# Patient Record
Sex: Female | Born: 1970 | Race: Black or African American | Hispanic: No | Marital: Married | Smoking: Never smoker
Health system: Southern US, Community
[De-identification: ages and names within clinical notes are randomized; demographics above are authoritative.]

## PROBLEM LIST (undated history)

## (undated) DIAGNOSIS — I10 Essential (primary) hypertension: Secondary | ICD-10-CM

## (undated) DIAGNOSIS — D649 Anemia, unspecified: Secondary | ICD-10-CM

## (undated) DIAGNOSIS — A6000 Herpesviral infection of urogenital system, unspecified: Secondary | ICD-10-CM

## (undated) HISTORY — PX: CHOLECYSTECTOMY: SHX55

## (undated) HISTORY — DX: Essential (primary) hypertension: I10

## (undated) HISTORY — PX: CERVICAL BIOPSY  W/ LOOP ELECTRODE EXCISION: SUR135

## (undated) HISTORY — DX: Anemia, unspecified: D64.9

## (undated) HISTORY — PX: TUBAL LIGATION: SHX77

## (undated) HISTORY — DX: Herpesviral infection of urogenital system, unspecified: A60.00

## (undated) HISTORY — PX: TONSILLECTOMY AND ADENOIDECTOMY: SUR1326

---

## 2018-05-02 LAB — HM MAMMOGRAPHY

## 2019-06-20 ENCOUNTER — Ambulatory Visit (INDEPENDENT_AMBULATORY_CARE_PROVIDER_SITE_OTHER): Payer: BC Managed Care – PPO | Admitting: Family Medicine

## 2019-06-20 ENCOUNTER — Encounter: Payer: Self-pay | Admitting: Family Medicine

## 2019-06-20 ENCOUNTER — Other Ambulatory Visit: Payer: Self-pay

## 2019-06-20 VITALS — BP 160/90 | HR 85 | Temp 98.9°F | Ht 64.0 in | Wt 172.6 lb

## 2019-06-20 DIAGNOSIS — D509 Iron deficiency anemia, unspecified: Secondary | ICD-10-CM

## 2019-06-20 DIAGNOSIS — Z7689 Persons encountering health services in other specified circumstances: Secondary | ICD-10-CM

## 2019-06-20 DIAGNOSIS — I10 Essential (primary) hypertension: Secondary | ICD-10-CM | POA: Insufficient documentation

## 2019-06-20 DIAGNOSIS — A6000 Herpesviral infection of urogenital system, unspecified: Secondary | ICD-10-CM | POA: Diagnosis not present

## 2019-06-20 DIAGNOSIS — N898 Other specified noninflammatory disorders of vagina: Secondary | ICD-10-CM

## 2019-06-20 DIAGNOSIS — R87619 Unspecified abnormal cytological findings in specimens from cervix uteri: Secondary | ICD-10-CM

## 2019-06-20 DIAGNOSIS — D649 Anemia, unspecified: Secondary | ICD-10-CM | POA: Insufficient documentation

## 2019-06-20 MED ORDER — VALACYCLOVIR HCL 1 G PO TABS: 2000.0000 mg | ORAL_TABLET | Freq: Two times a day (BID) | ORAL | 2 refills | Status: AC

## 2019-06-20 MED ORDER — LOSARTAN POTASSIUM 50 MG PO TABS: 50.0000 mg | ORAL_TABLET | Freq: Every day | ORAL | 1 refills | Status: DC

## 2019-06-20 NOTE — Progress Notes (Signed)
   Subjective:    Patient ID: Sabrina Crane, female    DOB: 03/26/1971, 49 y.o.   MRN: [id number]  HPI Chief Complaint  Patient presents with  . new pt    new pt, get estbalished. blood pressure issues, been out of meds for 3 days   She is new to the practice and here to establish care.  Previous medical care: [facility] medical in [place], [place] here because of her husbands job.   Last CPE: December 2020  Other providers: Needs OB/GYN here  Genital herpes and needs Valacyclovir refill. Takes this for outbreaks.   States she is waiting on results from a cervical biopsy.  States she has been having hot flashes, 2 periods per month, feeling irritable and thinks she is going through menopause. Reports having vaginal dryness and vaginal irritation for months.   States she was told recently that she has anemia. Does not take iron because she gets constipated.    HTN- diagnosed 2 years and has been out of medication for several days.    States she gets out of breath only when going up stairs. Recovers quickly. No chest pain, palpitations, cough.   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    Review of Systems Pertinent positives and negatives in the history of present illness.     Objective:   Physical Exam BP (!) 160/90   Pulse 85   Temp 98.9 F (37.2 C)   Ht 5\' 4"  (1.626 m)   Wt 172 lb 9.6 oz (78.3 kg)   LMP 05/28/2019 Comment: tubal   BMI 29.63 kg/m   Alert and in no distress.  Cardiac exam shows a regular rhythm without murmurs or gallops. Lungs are clear to auscultation. Extremities without edema. Skin is warm and dry.       Assessment & Plan:  Essential hypertension - Plan: losartan (COZAAR) 50 MG tablet Start her back on her medication at the  dose she was taking and she will monitor blood pressure closely over the next 4 weeks.  Recommend low-sodium diet.  DASH diet handout provided.  Follow-up in 4 weeks  Abnormal cervical Papanicolaou smear, unspecified abnormal pap finding -She will call and schedule with an OB/GYN.  I will request records from her OB/GYN in [place].  Genital herpes simplex, unspecified site - Plan: valACYclovir (VALTREX) 1000 MG tablet -Antiviral therapy prescribed to take as needed for outbreaks  Iron deficiency anemia, unspecified iron deficiency anemia type -She had recent labs done in her previous PCP.  I will request the records.  Recommend that she take a stool softener with her iron supplement  Encounter to establish care  Vaginal dryness -Follow-up with OB/GYN.  Discussed using over-the-counter Replens

## 2019-06-20 NOTE — Patient Instructions (Addendum)
Start back on losartan once daily.  Eat a low sodium diet (no more than 2,300 mg per day) Increase your physical activity.   Keep a close eye on your blood pressure at home.  Keep a record of your readings and bring in your list and BP machine to your follow up visit.    DASH Eating Plan DASH stands for "Dietary Approaches to Stop Hypertension." The DASH eating plan is a healthy eating plan that has been shown to reduce high blood pressure (hypertension). It may also reduce your risk for type 2 diabetes, heart disease, and stroke. The DASH eating plan may also help with weight loss. What are tips for following this plan?  General guidelines  Avoid eating more than 2,300 mg (milligrams) of salt (sodium) a day. If you have hypertension, you may need to reduce your sodium intake to 1,500 mg a day.  Limit alcohol intake to no more than 1 drink a day for nonpregnant women and 2 drinks a day for men. One drink equals 12 oz of beer, 5 oz of wine, or 1 oz of hard liquor.  Work with your health care provider to maintain a healthy body weight or to lose weight. Ask what an ideal weight is for you.  Get at least 30 minutes of exercise that causes your heart to beat faster (aerobic exercise) most days of the week. Activities may include walking, swimming, or biking.  Work with your health care provider or diet and nutrition specialist (dietitian) to adjust your eating plan to your individual calorie needs. Reading food labels   Check food labels for the amount of sodium per serving. Choose foods with less than 5 percent of the Daily Value of sodium. Generally, foods with less than 300 mg of sodium per serving fit into this eating plan.  To find whole grains, look for the word "whole" as the first word in the ingredient list. Shopping  Buy products labeled as "low-sodium" or "no salt added."  Buy fresh foods. Avoid canned foods and premade or frozen meals. Cooking  Avoid adding salt when  cooking. Use salt-free seasonings or herbs instead of table salt or sea salt. Check with your health care provider or pharmacist before using salt substitutes.  Do not fry foods. Cook foods using healthy methods such as baking, boiling, grilling, and broiling instead.  Cook with heart-healthy oils, such as olive, canola, soybean, or sunflower oil. Meal planning  Eat a balanced diet that includes: ? 5 or more servings of fruits and vegetables each day. At each meal, try to fill half of your plate with fruits and vegetables. ? Up to 6-8 servings of whole grains each day. ? Less than 6 oz of lean meat, poultry, or fish each day. A 3-oz serving of meat is about the same size as a deck of cards. One egg equals 1 oz. ? 2 servings of low-fat dairy each day. ? A serving of nuts, seeds, or beans 5 times each week. ? Heart-healthy fats. Healthy fats called Omega-3 fatty acids are found in foods such as flaxseeds and coldwater fish, like sardines, salmon, and mackerel.  Limit how much you eat of the following: ? Canned or prepackaged foods. ? Food that is high in trans fat, such as fried foods. ? Food that is high in saturated fat, such as fatty meat. ? Sweets, desserts, sugary drinks, and other foods with added sugar. ? Full-fat dairy products.  Do not salt foods before eating.  Try to  eat at least 2 vegetarian meals each week.  Eat more home-cooked food and less restaurant, buffet, and fast food.  When eating at a restaurant, ask that your food be prepared with less salt or no salt, if possible. What foods are recommended? The items listed may not be a complete list. Talk with your dietitian about what dietary choices are best for you. Grains Whole-grain or whole-wheat bread. Whole-grain or whole-wheat pasta. Brown rice. [place]. Bulgur. Whole-grain and low-sodium cereals. Pita bread. Low-fat, low-sodium crackers. Whole-wheat flour tortillas. Vegetables Fresh or frozen vegetables  (raw, steamed, roasted, or grilled). Low-sodium or reduced-sodium tomato and vegetable juice. Low-sodium or reduced-sodium tomato sauce and tomato paste. Low-sodium or reduced-sodium canned vegetables. Fruits All fresh, dried, or frozen fruit. Canned fruit in natural juice (without added sugar). Meat and other protein foods Skinless chicken or [place]. Ground chicken or [place]. Pork with fat trimmed off. Fish and seafood. Egg whites. Dried beans, peas, or lentils. Unsalted nuts, nut butters, and seeds. Unsalted canned beans. Lean cuts of beef with fat trimmed off. Low-sodium, lean deli meat. Dairy Low-fat (1%) or fat-free (skim) milk. Fat-free, low-fat, or reduced-fat cheeses. Nonfat, low-sodium ricotta or cottage cheese. Low-fat or nonfat yogurt. Low-fat, low-sodium cheese. Fats and oils Soft margarine without trans fats. Vegetable oil. Low-fat, reduced-fat, or light mayonnaise and salad dressings (reduced-sodium). Canola, safflower, olive, soybean, and sunflower oils. Avocado. Seasoning and other foods Herbs. Spices. Seasoning mixes without salt. Unsalted popcorn and pretzels. Fat-free sweets. What foods are not recommended? The items listed may not be a complete list. Talk with your dietitian about what dietary choices are best for you. Grains Baked goods made with fat, such as croissants, muffins, or some breads. Dry pasta or rice meal packs. Vegetables Creamed or fried vegetables. Vegetables in a cheese sauce. Regular canned vegetables (not low-sodium or reduced-sodium). Regular canned tomato sauce and paste (not low-sodium or reduced-sodium). Regular tomato and vegetable juice (not low-sodium or reduced-sodium). Angie Fava. Olives. Fruits Canned fruit in a light or heavy syrup. Fried fruit. Fruit in cream or butter sauce. Meat and other protein foods Fatty cuts of meat. Ribs. Fried meat. Berniece Salines. Sausage. Bologna and other processed lunch meats. Salami. Fatback. Hotdogs. Bratwurst. Salted nuts  and seeds. Canned beans with added salt. Canned or smoked fish. Whole eggs or egg yolks. Chicken or [place] with skin. Dairy Whole or 2% milk, cream, and half-and-half. Whole or full-fat cream cheese. Whole-fat or sweetened yogurt. Full-fat cheese. Nondairy creamers. Whipped toppings. Processed cheese and cheese spreads. Fats and oils Butter. Stick margarine. Lard. Shortening. Ghee. Bacon fat. Tropical oils, such as coconut, palm kernel, or palm oil. Seasoning and other foods Salted popcorn and pretzels. Onion salt, garlic salt, seasoned salt, table salt, and sea salt. Worcestershire sauce. Tartar sauce. Barbecue sauce. Teriyaki sauce. Soy sauce, including reduced-sodium. Steak sauce. Canned and packaged gravies. Fish sauce. Oyster sauce. Cocktail sauce. Horseradish that you find on the shelf. Ketchup. Mustard. Meat flavorings and tenderizers. Bouillon cubes. Hot sauce and Tabasco sauce. Premade or packaged marinades. Premade or packaged taco seasonings. Relishes. Regular salad dressings. Where to find more information:  National Heart, Lung, and [place]: https://wilson-eaton.com/  American Heart Association: www.heart.org Summary  The DASH eating plan is a healthy eating plan that has been shown to reduce high blood pressure (hypertension). It may also reduce your risk for type 2 diabetes, heart disease, and stroke.  With the DASH eating plan, you should limit salt (sodium) intake to 2,300 mg a day. If you have  hypertension, you may need to reduce your sodium intake to 1,500 mg a day.  When on the DASH eating plan, aim to eat more fresh fruits and vegetables, whole grains, lean proteins, low-fat dairy, and heart-healthy fats.  Work with your health care provider or diet and nutrition specialist (dietitian) to adjust your eating plan to your individual calorie needs. This information is not intended to replace advice given to you by your health care provider. Make sure you discuss any questions  you have with your health care provider. Document Revised: 04/14/2017 Document Reviewed: 04/25/2016 Elsevier Patient Education  2020 [place] Offices:   [place] [address] [place], [place] [phone]  [facility] Address: [address] [place], [place] [address] Phone: [phone]  [place] [address] [place], [place] [address] Phone: [phone]  [place], [place] [address] Phone: [phone]

## 2019-06-21 ENCOUNTER — Telehealth: Payer: Self-pay | Admitting: Family Medicine

## 2019-06-21 NOTE — Telephone Encounter (Signed)
Requested records received from [facility].

## 2019-07-03 ENCOUNTER — Encounter: Payer: Self-pay | Admitting: Family Medicine

## 2019-07-17 NOTE — Progress Notes (Signed)
   Subjective:    Patient ID: Sabrina Crane, female    DOB: May 09, 1971, 49 y.o.   MRN: [id number]  HPI Chief Complaint  Patient presents with  . HTN-    HTN- follow-up, bp ranging 130-160's/ 90-100's   Here to follow up on HTN.   BP at home has been mostly near goal range.   Diet- fairly unhealthy  Exercise - none  Anemia- hx of this and unsure as to the etiology. No records from her PCP in [facility] still  Never had a colonoscopy and is due.   States she is going to schedule with gyn for hx of abnormal pap smear, vaginal irritation and dyspareunia.      Review of Systems Pertinent positives and negatives in the history of present illness.     Objective:   Physical Exam BP 130/70   Pulse 86   Temp (!) 97.3 F (36.3 C)   Wt 173 lb 6.4 oz (78.7 kg)   BMI 29.76 kg/m   Alert and oriented and in no acute distress. Not otherwise examined.       Assessment & Plan:  Essential hypertension - Plan: CBC with Differential/Platelet, Comprehensive metabolic panel  Iron deficiency anemia, unspecified iron deficiency anemia type - Plan: CBC with Differential/Platelet, Iron, TIBC and Ferritin Panel  Screen for colon cancer - Plan: Ambulatory referral to Gastroenterology  Her BP is in goal range. Continue on medication and low sodium diet.  Unclear anemia etiology. She is still having menstrual cycles.  States she has never had a colonoscopy. She is due. Referral made.  Follow up pending results.

## 2019-07-18 ENCOUNTER — Encounter: Payer: Self-pay | Admitting: Family Medicine

## 2019-07-18 ENCOUNTER — Ambulatory Visit: Payer: BC Managed Care – PPO | Admitting: Family Medicine

## 2019-07-18 ENCOUNTER — Other Ambulatory Visit: Payer: Self-pay

## 2019-07-18 VITALS — BP 130/70 | HR 86 | Temp 97.3°F | Wt 173.4 lb

## 2019-07-18 DIAGNOSIS — I10 Essential (primary) hypertension: Secondary | ICD-10-CM

## 2019-07-18 DIAGNOSIS — Z1211 Encounter for screening for malignant neoplasm of colon: Secondary | ICD-10-CM

## 2019-07-18 DIAGNOSIS — D509 Iron deficiency anemia, unspecified: Secondary | ICD-10-CM

## 2019-07-19 LAB — CBC WITH DIFFERENTIAL/PLATELET
Basophils Absolute: 0.1 10*3/uL (ref 0.0–0.2)
Basos: 1 %
EOS (ABSOLUTE): 0.2 10*3/uL (ref 0.0–0.4)
Eos: 3 %
Hematocrit: 35.8 % (ref 34.0–46.6)
Hemoglobin: 10.5 g/dL — ABNORMAL LOW (ref 11.1–15.9)
Immature Grans (Abs): 0 10*3/uL (ref 0.0–0.1)
Immature Granulocytes: 0 %
Lymphocytes Absolute: 2.3 10*3/uL (ref 0.7–3.1)
Lymphs: 41 %
MCH: 22.3 pg — ABNORMAL LOW (ref 26.6–33.0)
MCHC: 29.3 g/dL — ABNORMAL LOW (ref 31.5–35.7)
MCV: 76 fL — ABNORMAL LOW (ref 79–97)
Monocytes Absolute: 0.4 10*3/uL (ref 0.1–0.9)
Monocytes: 7 %
Neutrophils Absolute: 2.7 10*3/uL (ref 1.4–7.0)
Neutrophils: 48 %
Platelets: 462 10*3/uL — ABNORMAL HIGH (ref 150–450)
RBC: 4.71 x10E6/uL (ref 3.77–5.28)
RDW: 17.6 % — ABNORMAL HIGH (ref 11.7–15.4)
WBC: 5.6 10*3/uL (ref 3.4–10.8)

## 2019-07-19 LAB — COMPREHENSIVE METABOLIC PANEL
ALT: 19 IU/L (ref 0–32)
AST: 13 IU/L (ref 0–40)
Albumin/Globulin Ratio: 1.6 (ref 1.2–2.2)
Albumin: 4.2 g/dL (ref 3.8–4.8)
Alkaline Phosphatase: 68 IU/L (ref 39–117)
BUN/Creatinine Ratio: 15 (ref 9–23)
BUN: 11 mg/dL (ref 6–24)
Bilirubin Total: 0.2 mg/dL (ref 0.0–1.2)
CO2: 23 mmol/L (ref 20–29)
Calcium: 9.4 mg/dL (ref 8.7–10.2)
Chloride: 104 mmol/L (ref 96–106)
Creatinine, Ser: 0.75 mg/dL (ref 0.57–1.00)
GFR calc Af Amer: 107 mL/min/{1.73_m2} (ref >59)
GFR calc non Af Amer: 93 mL/min/{1.73_m2} (ref >59)
Globulin, Total: 2.6 g/dL (ref 1.5–4.5)
Glucose: 88 mg/dL (ref 65–99)
Potassium: 3.9 mmol/L (ref 3.5–5.2)
Sodium: 140 mmol/L (ref 134–144)
Total Protein: 6.8 g/dL (ref 6.0–8.5)

## 2019-07-19 LAB — IRON,TIBC AND FERRITIN PANEL
Ferritin: 13 ng/mL — ABNORMAL LOW (ref 15–150)
Iron Saturation: 9 % — CL (ref 15–55)
Iron: 32 ug/dL (ref 27–159)
Total Iron Binding Capacity: 361 ug/dL (ref 250–450)
UIBC: 329 ug/dL (ref 131–425)

## 2019-07-21 NOTE — Progress Notes (Signed)
She dose have anemia and her iron stores are very low. I recommend that she start taking a once daily over the counter iron supplement. She should consider taking a stool softener with the iron as well since iron can cause constipation. She should have her labs rechecked in 8 weeks. Otherwise, her labs were fine.

## 2019-08-02 ENCOUNTER — Telehealth: Payer: Self-pay | Admitting: Family Medicine

## 2019-08-02 NOTE — Telephone Encounter (Signed)
Requested records received from [facility]

## 2019-08-09 ENCOUNTER — Encounter: Payer: Self-pay | Admitting: Family Medicine

## 2019-08-18 ENCOUNTER — Other Ambulatory Visit: Payer: Self-pay | Admitting: Family Medicine

## 2019-08-18 DIAGNOSIS — I10 Essential (primary) hypertension: Secondary | ICD-10-CM

## 2019-09-13 ENCOUNTER — Encounter: Payer: Self-pay | Admitting: Family Medicine

## 2019-09-13 ENCOUNTER — Encounter: Payer: Self-pay | Admitting: Internal Medicine

## 2019-11-12 ENCOUNTER — Encounter: Payer: Self-pay | Admitting: Family Medicine

## 2019-11-12 DIAGNOSIS — D509 Iron deficiency anemia, unspecified: Secondary | ICD-10-CM

## 2019-11-12 HISTORY — DX: Iron deficiency anemia, unspecified: D50.9

## 2019-11-13 ENCOUNTER — Ambulatory Visit (INDEPENDENT_AMBULATORY_CARE_PROVIDER_SITE_OTHER): Payer: BC Managed Care – PPO | Admitting: Family Medicine

## 2019-11-13 ENCOUNTER — Other Ambulatory Visit: Payer: Self-pay

## 2019-11-13 ENCOUNTER — Encounter: Payer: Self-pay | Admitting: Family Medicine

## 2019-11-13 VITALS — BP 160/90 | HR 89 | Temp 97.7°F | Wt 169.0 lb

## 2019-11-13 DIAGNOSIS — I10 Essential (primary) hypertension: Secondary | ICD-10-CM

## 2019-11-13 MED ORDER — LOSARTAN POTASSIUM-HCTZ 50-12.5 MG PO TABS: 1.0000 | ORAL_TABLET | Freq: Every day | ORAL | 1 refills | Status: DC

## 2019-11-13 NOTE — Progress Notes (Signed)
   Subjective:    Patient ID: Sabrina Crane, female    DOB: May 17, 1970, 49 y.o.   MRN: [id number]  HPI Chief Complaint  Patient presents with  . elevated bp    elevated bp 200/113 x 2 days. today was 169/116. no chest pain or no SOB, having headaches started saturday    She is here with concerns regarding elevated BP at home for the past 2 days.   States prior to last week her blood pressures were fine.  States 2 days ago she had a headache so she checked her blood pressure and it was 200/113.  States this morning her blood pressure was 169/116.  She does not have a headache today.  Reports taking losartan 50 mg daily.  States she has been eating out a lot.  She ate at [employer] twice over the past week.  She eats a lot of soup.  Her diet has been high in sodium. She drinks soda and does not like to drink water.  Denies fever, chills, headaches, dizziness, vision changes, chest pain, palpitations, shortness of breath, nausea, vomiting, diarrhea, urinary symptoms or LE edema.   She had low iron at her previous visit in March 2021.  States she has not been taking oral iron.    States she saw her OB/GYN and had a pleasant experience.   Review of Systems Pertinent positives and negatives in the history of present illness.     Objective:   Physical Exam BP (!) 160/90   Pulse 89   Temp 97.7 F (36.5 C)   Wt 169 lb (76.7 kg)   SpO2 98%   BMI 29.01 kg/m   Alert and in no distress. Cardiac exam shows a regular sinus rhythm without murmurs or gallops. Lungs are clear to auscultation.  Extremities without edema.  Skin is warm dry.      Assessment & Plan:  Essential hypertension - Plan: losartan-hydrochlorothiazide (HYZAAR) 50-12.5 MG tablet  I recommend stopping the losartan and starting on losartan/HCTZ.  In-depth counseling on hypertension and lifestyle modifications to help control it.  Recommend low-sodium diet and increasing her water intake.  She is asymptomatic.  She  has an appointment next week for her CPE.  Encouraged her to bring in her blood pressure machine from home and her readings.

## 2019-11-18 NOTE — Progress Notes (Signed)
Subjective:    Patient ID: Sabrina Crane, female    DOB: [birth year], 49 y.o.   MRN: [id number]  HPI Chief Complaint  Patient presents with  . fasting cpe    fasting cpe, sees obgyn. no other concerns   She is here for a complete physical exam. Previous medical care: in [facility]  Last CPE: years ago   Other providers: OB/GYN-  appt in September   Periods are regular but she also has bleeding in between. Hx of IDA- states she is has been tired and has been craving ice  She has never had a colonoscopy   HTN- BP improved since adding HCTZ to losartan.   Social history: Lives with husband, has 4 kids, works at the [employer] in [employer] smoking, drinking alcohol, drug use  Diet: improving. Eating lower sodium  Excerise: nothing regular   Immunizations: Tdap more than 10 years.   Health maintenance:  Mammogram: over a year ago in [facility]  Colonoscopy: never  Last Gynecological Exam: 2020 in [place] Exam: over a year ago  Last Eye Exam: in the past month to [place]   Wears seatbelt always, smoke detectors in home and functioning, does not text while driving and feels safe in home environment.   Reviewed allergies, medications, past medical, surgical, family, and social history.   Review of Systems Review of Systems Constitutional: -fever, -chills, -sweats, -unexpected weight change,+fatigue ENT: -runny nose, -ear pain, -sore throat Cardiology:  -chest pain, -palpitations, -edema Respiratory: -cough, -shortness of breath, -wheezing Gastroenterology: -abdominal pain, -nausea, -vomiting, -diarrhea, -constipation  Hematology: -bleeding or bruising problems Musculoskeletal: -arthralgias, -myalgias, -joint swelling, -back pain Ophthalmology: -vision changes Urology: -dysuria, -difficulty urinating, -hematuria, -urinary frequency, -urgency Neurology: -headache, -weakness, -tingling, -numbness       Objective:   Physical Exam  BP 120/74   Pulse 91   Ht 5' 3.75"  (1.619 m)   Wt 165 lb 9.6 oz (75.1 kg)   LMP 10/24/2019   BMI 28.65 kg/m   General Appearance:    Alert, cooperative, no distress, appears stated age  Head:    Normocephalic, without obvious abnormality, atraumatic  Eyes:    PERRL, conjunctiva/corneas clear, EOM's intact  Ears:    Normal TM's and external ear canals  Nose:   Mask in place   Throat:   Mask in place   Neck:   Supple, no lymphadenopathy;  thyroid:  no   enlargement/tenderness/nodules; no JVD  Back:    Spine nontender, no curvature, ROM normal, no CVA     tenderness  Lungs:     Clear to auscultation bilaterally without wheezes, rales or     ronchi; respirations unlabored  Chest Wall:    No tenderness or deformity   Heart:    Regular rate and rhythm, S1 and S2 normal, no murmur, rub   or gallop  Breast Exam:    OB/GYN  Abdomen:     Soft, non-tender, nondistended, normoactive bowel sounds,    no masses, no hepatosplenomegaly  Genitalia:    OB/GYN     Extremities:   No clubbing, cyanosis or edema  Pulses:   2+ and symmetric all extremities  Skin:   Skin color, texture, turgor normal, no rashes or lesions  Lymph nodes:   Cervical, supraclavicular, and axillary nodes normal  Neurologic:   CNII-XII intact, normal strength, sensation and gait          Psych:   Normal mood, affect, hygiene and grooming.  Assessment & Plan:  Routine general medical examination at a health care facility - Plan: CBC with Differential/Platelet, Comprehensive metabolic panel, TSH, T4, free, T3, Lipid panel -Preventive health care reviewed and updated.  Previous CPE and OB/GYN in [place].  Counseling on healthy diet and exercise.  Immunizations reviewed.  Discussed safety and health promotion.  I recommend that she get regular dental checkups.  Essential hypertension - Plan: CBC with Differential/Platelet, Comprehensive metabolic panel -Blood pressure has improved significantly since her previous visit.  We added HCTZ to the  losartan.  DASH diet handout provided.  She has improved her diet by cutting back on sodium.  Iron deficiency anemia, unspecified iron deficiency anemia type - Plan: Ambulatory referral to Gastroenterology, CBC with Differential/Platelet, Vitamin B12, Iron, TIBC and Ferritin Panel -Discussed that this may be multifactorial or may be related to irregular and frequent periods.  She is not currently taking iron.  We will check a CBC and iron studies and start her on iron.  Referral to GI and she has an upcoming appointment with OB/GYN.  Need for hepatitis C screening test - Plan: Hepatitis C antibody -Done per guidelines  Abnormal uterine bleeding (AUB) - Plan: TSH, T4, free, T3 -She has an appointment with a new OB/GYN in September.  Check labs in follow-up  Immunization counseling -She is aware that she is overdue for Tdap and declines this today.  She may call and schedule a nurse visit.  States she has no interest in getting a Covid vaccine for now  Screen for colon cancer - Plan: Ambulatory referral to Gastroenterology -This will be her first colonoscopy.  Screening for HIV without presence of risk factors - Plan: HIV Antibody (routine testing w rflx) -Done per guidelines  Screening for lipid disorders - Plan: Lipid panel -Follow-up pending lipid panel

## 2019-11-18 NOTE — Patient Instructions (Addendum)
Your blood pressure is in goal range and I recommend that you continue your current medication.   You are overdue for your Tdap (tetanus, diptheria, and pertussis).  You can come back for a nurse visit to get this done.   You will hear from [facility] to schedule a visit for colon cancer screening.   Please see your OB/GYN as scheduled in September.   Eat a low sodium diet and increase your physical activity.    DASH Eating Plan DASH stands for "Dietary Approaches to Stop Hypertension." The DASH eating plan is a healthy eating plan that has been shown to reduce high blood pressure (hypertension). It may also reduce your risk for type 2 diabetes, heart disease, and stroke. The DASH eating plan may also help with weight loss. What are tips for following this plan?  General guidelines  Avoid eating more than 2,300 mg (milligrams) of salt (sodium) a day. If you have hypertension, you may need to reduce your sodium intake to 1,500 mg a day.  Limit alcohol intake to no more than 1 drink a day for nonpregnant women and 2 drinks a day for men. One drink equals 12 oz of beer, 5 oz of wine, or 1 oz of hard liquor.  Work with your health care provider to maintain a healthy body weight or to lose weight. Ask what an ideal weight is for you.  Get at least 30 minutes of exercise that causes your heart to beat faster (aerobic exercise) most days of the week. Activities may include walking, swimming, or biking.  Work with your health care provider or diet and nutrition specialist (dietitian) to adjust your eating plan to your individual calorie needs. Reading food labels   Check food labels for the amount of sodium per serving. Choose foods with less than 5 percent of the Daily Value of sodium. Generally, foods with less than 300 mg of sodium per serving fit into this eating plan.  To find whole grains, look for the word "whole" as the first word in the ingredient list. Shopping  Buy products  labeled as "low-sodium" or "no salt added."  Buy fresh foods. Avoid canned foods and premade or frozen meals. Cooking  Avoid adding salt when cooking. Use salt-free seasonings or herbs instead of table salt or sea salt. Check with your health care provider or pharmacist before using salt substitutes.  Do not fry foods. Cook foods using healthy methods such as baking, boiling, grilling, and broiling instead.  Cook with heart-healthy oils, such as olive, canola, soybean, or sunflower oil. Meal planning  Eat a balanced diet that includes: ? 5 or more servings of fruits and vegetables each day. At each meal, try to fill half of your plate with fruits and vegetables. ? Up to 6-8 servings of whole grains each day. ? Less than 6 oz of lean meat, poultry, or fish each day. A 3-oz serving of meat is about the same size as a deck of cards. One egg equals 1 oz. ? 2 servings of low-fat dairy each day. ? A serving of nuts, seeds, or beans 5 times each week. ? Heart-healthy fats. Healthy fats called Omega-3 fatty acids are found in foods such as flaxseeds and coldwater fish, like sardines, salmon, and mackerel.  Limit how much you eat of the following: ? Canned or prepackaged foods. ? Food that is high in trans fat, such as fried foods. ? Food that is high in saturated fat, such as fatty meat. ?  Sweets, desserts, sugary drinks, and other foods with added sugar. ? Full-fat dairy products.  Do not salt foods before eating.  Try to eat at least 2 vegetarian meals each week.  Eat more home-cooked food and less restaurant, buffet, and fast food.  When eating at a restaurant, ask that your food be prepared with less salt or no salt, if possible. What foods are recommended? The items listed may not be a complete list. Talk with your dietitian about what dietary choices are best for you. Grains Whole-grain or whole-wheat bread. Whole-grain or whole-wheat pasta. Brown rice. [place]. Bulgur.  Whole-grain and low-sodium cereals. Pita bread. Low-fat, low-sodium crackers. Whole-wheat flour tortillas. Vegetables Fresh or frozen vegetables (raw, steamed, roasted, or grilled). Low-sodium or reduced-sodium tomato and vegetable juice. Low-sodium or reduced-sodium tomato sauce and tomato paste. Low-sodium or reduced-sodium canned vegetables. Fruits All fresh, dried, or frozen fruit. Canned fruit in natural juice (without added sugar). Meat and other protein foods Skinless chicken or [place]. Ground chicken or [place]. Pork with fat trimmed off. Fish and seafood. Egg whites. Dried beans, peas, or lentils. Unsalted nuts, nut butters, and seeds. Unsalted canned beans. Lean cuts of beef with fat trimmed off. Low-sodium, lean deli meat. Dairy Low-fat (1%) or fat-free (skim) milk. Fat-free, low-fat, or reduced-fat cheeses. Nonfat, low-sodium ricotta or cottage cheese. Low-fat or nonfat yogurt. Low-fat, low-sodium cheese. Fats and oils Soft margarine without trans fats. Vegetable oil. Low-fat, reduced-fat, or light mayonnaise and salad dressings (reduced-sodium). Canola, safflower, olive, soybean, and sunflower oils. Avocado. Seasoning and other foods Herbs. Spices. Seasoning mixes without salt. Unsalted popcorn and pretzels. Fat-free sweets. What foods are not recommended? The items listed may not be a complete list. Talk with your dietitian about what dietary choices are best for you. Grains Baked goods made with fat, such as croissants, muffins, or some breads. Dry pasta or rice meal packs. Vegetables Creamed or fried vegetables. Vegetables in a cheese sauce. Regular canned vegetables (not low-sodium or reduced-sodium). Regular canned tomato sauce and paste (not low-sodium or reduced-sodium). Regular tomato and vegetable juice (not low-sodium or reduced-sodium). Angie Fava. Olives. Fruits Canned fruit in a light or heavy syrup. Fried fruit. Fruit in cream or butter sauce. Meat and other protein  foods Fatty cuts of meat. Ribs. Fried meat. Berniece Salines. Sausage. Bologna and other processed lunch meats. Salami. Fatback. Hotdogs. Bratwurst. Salted nuts and seeds. Canned beans with added salt. Canned or smoked fish. Whole eggs or egg yolks. Chicken or [place] with skin. Dairy Whole or 2% milk, cream, and half-and-half. Whole or full-fat cream cheese. Whole-fat or sweetened yogurt. Full-fat cheese. Nondairy creamers. Whipped toppings. Processed cheese and cheese spreads. Fats and oils Butter. Stick margarine. Lard. Shortening. Ghee. Bacon fat. Tropical oils, such as coconut, palm kernel, or palm oil. Seasoning and other foods Salted popcorn and pretzels. Onion salt, garlic salt, seasoned salt, table salt, and sea salt. Worcestershire sauce. Tartar sauce. Barbecue sauce. Teriyaki sauce. Soy sauce, including reduced-sodium. Steak sauce. Canned and packaged gravies. Fish sauce. Oyster sauce. Cocktail sauce. Horseradish that you find on the shelf. Ketchup. Mustard. Meat flavorings and tenderizers. Bouillon cubes. Hot sauce and Tabasco sauce. Premade or packaged marinades. Premade or packaged taco seasonings. Relishes. Regular salad dressings. Where to find more information:  National Heart, Lung, and [place]: https://wilson-eaton.com/  American Heart Association: www.heart.org Summary  The DASH eating plan is a healthy eating plan that has been shown to reduce high blood pressure (hypertension). It may also reduce your risk for type 2 diabetes,  heart disease, and stroke.  With the DASH eating plan, you should limit salt (sodium) intake to 2,300 mg a day. If you have hypertension, you may need to reduce your sodium intake to 1,500 mg a day.  When on the DASH eating plan, aim to eat more fresh fruits and vegetables, whole grains, lean proteins, low-fat dairy, and heart-healthy fats.  Work with your health care provider or diet and nutrition specialist (dietitian) to adjust your eating plan to your  individual calorie needs. This information is not intended to replace advice given to you by your health care provider. Make sure you discuss any questions you have with your health care provider. Document Revised: 04/14/2017 Document Reviewed: 04/25/2016 Elsevier Patient Education  2020 Elsevier Inc.      Preventive Care 83-60 Years Old, Female Preventive care refers to visits with your health care provider and lifestyle choices that can promote health and wellness. This includes:  A yearly physical exam. This may also be called an annual well check.  Regular dental visits and eye exams.  Immunizations.  Screening for certain conditions.  Healthy lifestyle choices, such as eating a healthy diet, getting regular exercise, not using drugs or products that contain nicotine and tobacco, and limiting alcohol use. What can I expect for my preventive care visit? Physical exam Your health care provider will check your:  Height and weight. This may be used to calculate body mass index (BMI), which tells if you are at a healthy weight.  Heart rate and blood pressure.  Skin for abnormal spots. Counseling Your health care provider may ask you questions about your:  Alcohol, tobacco, and drug use.  Emotional well-being.  Home and relationship well-being.  Sexual activity.  Eating habits.  Work and work Statistician.  Method of birth control.  Menstrual cycle.  Pregnancy history. What immunizations do I need?  Influenza (flu) vaccine  This is recommended every year. Tetanus, diphtheria, and pertussis (Tdap) vaccine  You may need a Td booster every 10 years. Varicella (chickenpox) vaccine  You may need this if you have not been vaccinated. Zoster (shingles) vaccine  You may need this after age 56. Measles, mumps, and rubella (MMR) vaccine  You may need at least one dose of MMR if you were born in 1957 or later. You may also need a second dose. Pneumococcal  conjugate (PCV13) vaccine  You may need this if you have certain conditions and were not previously vaccinated. Pneumococcal polysaccharide (PPSV23) vaccine  You may need one or two doses if you smoke cigarettes or if you have certain conditions. Meningococcal conjugate (MenACWY) vaccine  You may need this if you have certain conditions. Hepatitis A vaccine  You may need this if you have certain conditions or if you travel or work in places where you may be exposed to hepatitis A. Hepatitis B vaccine  You may need this if you have certain conditions or if you travel or work in places where you may be exposed to hepatitis B. Haemophilus influenzae type b (Hib) vaccine  You may need this if you have certain conditions. Human papillomavirus (HPV) vaccine  If recommended by your health care provider, you may need three doses over 6 months. You may receive vaccines as individual doses or as more than one vaccine together in one shot (combination vaccines). Talk with your health care provider about the risks and benefits of combination vaccines. What tests do I need? Blood tests  Lipid and cholesterol levels. These may be checked  every 5 years, or more frequently if you are over 60 years old.  Hepatitis C test.  Hepatitis B test. Screening  Lung cancer screening. You may have this screening every year starting at age 75 if you have a 30-pack-year history of smoking and currently smoke or have quit within the past 15 years.  Colorectal cancer screening. All adults should have this screening starting at age 19 and continuing until age 41. Your health care provider may recommend screening at age 19 if you are at increased risk. You will have tests every 1-10 years, depending on your results and the type of screening test.  Diabetes screening. This is done by checking your blood sugar (glucose) after you have not eaten for a while (fasting). You may have this done every 1-3  years.  Mammogram. This may be done every 1-2 years. Talk with your health care provider about when you should start having regular mammograms. This may depend on whether you have a family history of breast cancer.  BRCA-related cancer screening. This may be done if you have a family history of breast, ovarian, tubal, or peritoneal cancers.  Pelvic exam and Pap test. This may be done every 3 years starting at age 69. Starting at age 33, this may be done every 5 years if you have a Pap test in combination with an HPV test. Other tests  Sexually transmitted disease (STD) testing.  Bone density scan. This is done to screen for osteoporosis. You may have this scan if you are at high risk for osteoporosis. Follow these instructions at home: Eating and drinking  Eat a diet that includes fresh fruits and vegetables, whole grains, lean protein, and low-fat dairy.  Take vitamin and mineral supplements as recommended by your health care provider.  Do not drink alcohol if: ? Your health care provider tells you not to drink. ? You are pregnant, may be pregnant, or are planning to become pregnant.  If you drink alcohol: ? Limit how much you have to 0-1 drink a day. ? Be aware of how much alcohol is in your drink. In the [place] one drink equals one 12 oz bottle of beer (355 mL), one 5 oz glass of wine (148 mL), or one 1 oz glass of hard liquor (44 mL). Lifestyle  Take daily care of your teeth and gums.  Stay active. Exercise for at least 30 minutes on 5 or more days each week.  Do not use any products that contain nicotine or tobacco, such as cigarettes, e-cigarettes, and chewing tobacco. If you need help quitting, ask your health care provider.  If you are sexually active, practice safe sex. Use a condom or other form of birth control (contraception) in order to prevent pregnancy and STIs (sexually transmitted infections).  If told by your health care provider, take low-dose aspirin daily  starting at age 73. What's next?  Visit your health care provider once a year for a well check visit.  Ask your health care provider how often you should have your eyes and teeth checked.  Stay up to date on all vaccines. This information is not intended to replace advice given to you by your health care provider. Make sure you discuss any questions you have with your health care provider. Document Revised: 01/11/2018 Document Reviewed: 01/11/2018 Elsevier Patient Education  2020 [employer].

## 2019-11-19 ENCOUNTER — Other Ambulatory Visit: Payer: Self-pay

## 2019-11-19 ENCOUNTER — Encounter: Payer: Self-pay | Admitting: Family Medicine

## 2019-11-19 ENCOUNTER — Ambulatory Visit: Payer: BC Managed Care – PPO | Admitting: Family Medicine

## 2019-11-19 VITALS — BP 120/74 | HR 91 | Ht 63.75 in | Wt 165.6 lb

## 2019-11-19 DIAGNOSIS — Z7185 Encounter for immunization safety counseling: Secondary | ICD-10-CM

## 2019-11-19 DIAGNOSIS — N939 Abnormal uterine and vaginal bleeding, unspecified: Secondary | ICD-10-CM | POA: Diagnosis not present

## 2019-11-19 DIAGNOSIS — I10 Essential (primary) hypertension: Secondary | ICD-10-CM

## 2019-11-19 DIAGNOSIS — Z1211 Encounter for screening for malignant neoplasm of colon: Secondary | ICD-10-CM | POA: Diagnosis not present

## 2019-11-19 DIAGNOSIS — Z13220 Encounter for screening for lipoid disorders: Secondary | ICD-10-CM

## 2019-11-19 DIAGNOSIS — D509 Iron deficiency anemia, unspecified: Secondary | ICD-10-CM | POA: Diagnosis not present

## 2019-11-19 DIAGNOSIS — Z0000 Encounter for general adult medical examination without abnormal findings: Secondary | ICD-10-CM | POA: Diagnosis not present

## 2019-11-19 DIAGNOSIS — Z114 Encounter for screening for human immunodeficiency virus [HIV]: Secondary | ICD-10-CM

## 2019-11-19 DIAGNOSIS — Z1159 Encounter for screening for other viral diseases: Secondary | ICD-10-CM

## 2019-11-19 DIAGNOSIS — Z7189 Other specified counseling: Secondary | ICD-10-CM | POA: Diagnosis not present

## 2019-11-20 ENCOUNTER — Encounter: Payer: Self-pay | Admitting: Family Medicine

## 2019-11-20 DIAGNOSIS — D75839 Thrombocytosis, unspecified: Secondary | ICD-10-CM

## 2019-11-20 HISTORY — DX: Thrombocytosis, unspecified: D75.839

## 2019-11-20 LAB — VITAMIN B12: Vitamin B-12: 1783 pg/mL — ABNORMAL HIGH (ref 232–1245)

## 2019-11-20 LAB — LIPID PANEL
Chol/HDL Ratio: 4 ratio (ref 0.0–4.4)
Cholesterol, Total: 186 mg/dL (ref 100–199)
HDL: 46 mg/dL (ref >39)
LDL Chol Calc (NIH): 130 mg/dL — ABNORMAL HIGH (ref 0–99)
Triglycerides: 51 mg/dL (ref 0–149)
VLDL Cholesterol Cal: 10 mg/dL (ref 5–40)

## 2019-11-20 LAB — COMPREHENSIVE METABOLIC PANEL
ALT: 12 IU/L (ref 0–32)
AST: 15 IU/L (ref 0–40)
Albumin/Globulin Ratio: 1.5 (ref 1.2–2.2)
Albumin: 4.7 g/dL (ref 3.8–4.8)
Alkaline Phosphatase: 64 IU/L (ref 48–121)
BUN/Creatinine Ratio: 17 (ref 9–23)
BUN: 14 mg/dL (ref 6–24)
Bilirubin Total: 0.3 mg/dL (ref 0.0–1.2)
CO2: 25 mmol/L (ref 20–29)
Calcium: 9.7 mg/dL (ref 8.7–10.2)
Chloride: 99 mmol/L (ref 96–106)
Creatinine, Ser: 0.84 mg/dL (ref 0.57–1.00)
GFR calc Af Amer: 94 mL/min/{1.73_m2} (ref >59)
GFR calc non Af Amer: 81 mL/min/{1.73_m2} (ref >59)
Globulin, Total: 3.1 g/dL (ref 1.5–4.5)
Glucose: 100 mg/dL — ABNORMAL HIGH (ref 65–99)
Potassium: 3.8 mmol/L (ref 3.5–5.2)
Sodium: 140 mmol/L (ref 134–144)
Total Protein: 7.8 g/dL (ref 6.0–8.5)

## 2019-11-20 LAB — CBC WITH DIFFERENTIAL/PLATELET
Basophils Absolute: 0.1 10*3/uL (ref 0.0–0.2)
Basos: 1 %
EOS (ABSOLUTE): 0.2 10*3/uL (ref 0.0–0.4)
Eos: 4 %
Hematocrit: 40.7 % (ref 34.0–46.6)
Hemoglobin: 11.9 g/dL (ref 11.1–15.9)
Immature Grans (Abs): 0 10*3/uL (ref 0.0–0.1)
Immature Granulocytes: 0 %
Lymphocytes Absolute: 2.4 10*3/uL (ref 0.7–3.1)
Lymphs: 46 %
MCH: 21.8 pg — ABNORMAL LOW (ref 26.6–33.0)
MCHC: 29.2 g/dL — ABNORMAL LOW (ref 31.5–35.7)
MCV: 75 fL — ABNORMAL LOW (ref 79–97)
Monocytes Absolute: 0.4 10*3/uL (ref 0.1–0.9)
Monocytes: 7 %
Neutrophils Absolute: 2.2 10*3/uL (ref 1.4–7.0)
Neutrophils: 42 %
Platelets: 516 10*3/uL — ABNORMAL HIGH (ref 150–450)
RBC: 5.45 x10E6/uL — ABNORMAL HIGH (ref 3.77–5.28)
RDW: 15.6 % — ABNORMAL HIGH (ref 11.7–15.4)
WBC: 5.2 10*3/uL (ref 3.4–10.8)

## 2019-11-20 LAB — T3: T3, Total: 124 ng/dL (ref 71–180)

## 2019-11-20 LAB — IRON,TIBC AND FERRITIN PANEL
Ferritin: 14 ng/mL — ABNORMAL LOW (ref 15–150)
Iron Saturation: 17 % (ref 15–55)
Iron: 74 ug/dL (ref 27–159)
Total Iron Binding Capacity: 443 ug/dL (ref 250–450)
UIBC: 369 ug/dL (ref 131–425)

## 2019-11-20 LAB — TSH: TSH: 0.537 u[IU]/mL (ref 0.450–4.500)

## 2019-11-20 LAB — HIV ANTIBODY (ROUTINE TESTING W REFLEX): HIV Screen 4th Generation wRfx: NONREACTIVE

## 2019-11-20 LAB — T4, FREE: Free T4: 1.04 ng/dL (ref 0.82–1.77)

## 2019-11-20 LAB — HEPATITIS C ANTIBODY: Hep C Virus Ab: <0.1 s/co ratio (ref 0.0–0.9)

## 2019-11-21 ENCOUNTER — Encounter: Payer: Self-pay | Admitting: Gastroenterology

## 2019-12-04 ENCOUNTER — Ambulatory Visit: Payer: BC Managed Care – PPO | Admitting: Medical

## 2019-12-05 ENCOUNTER — Ambulatory Visit: Payer: BC Managed Care – PPO | Admitting: Medical

## 2019-12-05 ENCOUNTER — Other Ambulatory Visit: Payer: Self-pay

## 2019-12-05 ENCOUNTER — Encounter: Payer: Self-pay | Admitting: Medical

## 2019-12-05 VITALS — BP 130/76 | HR 81 | Ht 63.75 in | Wt 166.6 lb

## 2019-12-05 DIAGNOSIS — Z800 Family history of malignant neoplasm of digestive organs: Secondary | ICD-10-CM | POA: Diagnosis not present

## 2019-12-05 DIAGNOSIS — L989 Disorder of the skin and subcutaneous tissue, unspecified: Secondary | ICD-10-CM

## 2019-12-05 DIAGNOSIS — L918 Other hypertrophic disorders of the skin: Secondary | ICD-10-CM | POA: Diagnosis not present

## 2019-12-05 DIAGNOSIS — D229 Melanocytic nevi, unspecified: Secondary | ICD-10-CM | POA: Diagnosis not present

## 2019-12-05 NOTE — Progress Notes (Signed)
Subjective:  Sabrina Crane is a 49 y.o. female who presents for Chief Complaint  Patient presents with  . Nevus    wants moles on neck removed      Here for skin lesions of concern.  She has several moles or lesions around her neck they get caught on her shirt and get irritated.  They get red and inflamed at times.  She also has a lesion on her left shoulder she wants looked at.  No other aggravating or relieving factors.    No other c/o.  Past Medical History:  Diagnosis Date  . Anemia   . Genital herpes   . Hypertension   . IDA (iron deficiency anemia) 11/12/2019  . Thrombocytosis ([place]) 11/20/2019   Current Outpatient Medications on File Prior to Visit  Medication Sig Dispense Refill  . losartan-hydrochlorothiazide (HYZAAR) 50-12.5 MG tablet Take 1 tablet by mouth daily. 90 tablet 1  . valACYclovir (VALTREX) 1000 MG tablet Take 2 tablets (2,000 mg total) by mouth 2 (two) times daily. X 1 day at onset of outbreak (Patient not taking: Reported on 12/05/2019) 30 tablet 2   No current facility-administered medications on file prior to visit.    The following portions of the patient's history were reviewed and updated as appropriate: allergies, current medications, past family history, past medical history, past social history, past surgical history and problem list.  ROS Otherwise as in subjective above  Objective: BP 130/76   Pulse 81   Ht 5' 3.75" (1.619 m)   Wt 166 lb 9.6 oz (75.6 kg)   SpO2 98%   BMI 28.82 kg/m   General appearance: alert, no distress, well developed, well nourished Left neck with 4 skin tags present, one larger pedunculated  round approximately 4 mm diameter inflamed at the base with uniform color brownish-pink, the other 2 are also pedunculated smaller 2-3 mm diameter uniformly colored skin tags of the left neck, similarly there are 4 skin tags of the right neck, all pedunculated, 2 are smaller less than 2 mm x 2 mm, the other 2 are larger in 3-4 mm x 2 mm  size, the 2 larger ones are irritated appearing, left shoulder superiorly with brown round slightly raised but relatively flat uniformly colored well-defined nevus, benign appearing, approximately 4 mm diameter   Assessment: Encounter Diagnoses  Name Primary?  . Inflamed skin tag Yes  . Skin lesion   . Family history of cholangiocarcinoma   . Nevus      Plan: We discussed the findings.  We discussed that skin tag your fibroepithelial polyps or not cancerous but she has several other irritated from where her shirt aggravates these.  She requested excision.  Cleaned and prepped 3 skin tags left neck and 4 skin tags right neck in usual sterile fashion, to the larger lesions we used 1% lidocaine with epi for local anesthesia but the rest were used ethyl acetate spray for local anesthesia, use sterile forceps and scissors to excise all 7 of the skin tags.  Benign appearing otherwise.  Applied triple antibiotic ointment.  Patient tolerated procedure well.  Less than 1 ml blood loss  We discussed wound care.  Watch for signs of infection.  I advise she take a picture with a ruler with reference for the left shoulder mole.  Advised that this is benign.  She decided not to remove this today.  She wanted her PCP here Sabrina Crane to be aware that her sister was diagnosed with cholangiocarcinoma   Sabrina Crane was  seen today for nevus.  Diagnoses and all orders for this visit:  Inflamed skin tag  Skin lesion  Family history of cholangiocarcinoma  Nevus    Follow up: As needed

## 2019-12-16 ENCOUNTER — Encounter: Payer: Self-pay | Admitting: Family Medicine

## 2019-12-22 NOTE — Progress Notes (Signed)
   Subjective:    Patient ID: Sabrina Crane, female    DOB: 14-Jun-1970, 49 y.o.   MRN: [id number]  HPI Chief Complaint  Patient presents with  . follow-up    follow-up on platelets   She is here to follow up on elevated platelets and improving IDA.   States she has been taking OTC oral iron once daily.   States she feels fine and no new concerns.   Denies fever, chills, dizziness, headache, chest pain, palpitations, shortness of breath, abdominal pain, N/V/D.     Review of Systems Pertinent positives and negatives in the history of present illness.     Objective:   Physical Exam BP 110/70   Pulse 81   Wt 164 lb 3.2 oz (74.5 kg)   SpO2 99%   BMI 28.41 kg/m   Alert and oriented and in no acute distress. Not otherwise examined.       Assessment & Plan:  Thrombocytosis ([place]) - Plan: CBC with Differential/Platelet, Iron, TIBC and Ferritin Panel  Iron deficiency anemia, unspecified iron deficiency anemia type - Plan: CBC with Differential/Platelet, Iron, TIBC and Ferritin Panel  She is in her usual state of health. We will recheck CBC and iron studies. If her platelets are still significantly elevated I will refer her to hematology and she is aware.

## 2019-12-23 ENCOUNTER — Encounter: Payer: Self-pay | Admitting: Family Medicine

## 2019-12-23 ENCOUNTER — Other Ambulatory Visit: Payer: Self-pay

## 2019-12-23 ENCOUNTER — Ambulatory Visit (INDEPENDENT_AMBULATORY_CARE_PROVIDER_SITE_OTHER): Payer: BC Managed Care – PPO | Admitting: Family Medicine

## 2019-12-23 VITALS — BP 110/70 | HR 81 | Wt 164.2 lb

## 2019-12-23 DIAGNOSIS — D509 Iron deficiency anemia, unspecified: Secondary | ICD-10-CM | POA: Diagnosis not present

## 2019-12-23 DIAGNOSIS — D473 Essential (hemorrhagic) thrombocythemia: Secondary | ICD-10-CM | POA: Diagnosis not present

## 2019-12-23 DIAGNOSIS — D75839 Thrombocytosis, unspecified: Secondary | ICD-10-CM

## 2019-12-24 ENCOUNTER — Encounter: Payer: Self-pay | Admitting: Family Medicine

## 2019-12-24 LAB — CBC WITH DIFFERENTIAL/PLATELET
Basophils Absolute: 0 10*3/uL (ref 0.0–0.2)
Basos: 0 %
EOS (ABSOLUTE): 0 10*3/uL (ref 0.0–0.4)
Eos: 1 %
Hematocrit: 35.6 % (ref 34.0–46.6)
Hemoglobin: 10.9 g/dL — ABNORMAL LOW (ref 11.1–15.9)
Immature Grans (Abs): 0 10*3/uL (ref 0.0–0.1)
Immature Granulocytes: 0 %
Lymphocytes Absolute: 1.4 10*3/uL (ref 0.7–3.1)
Lymphs: 53 %
MCH: 22.3 pg — ABNORMAL LOW (ref 26.6–33.0)
MCHC: 30.6 g/dL — ABNORMAL LOW (ref 31.5–35.7)
MCV: 73 fL — ABNORMAL LOW (ref 79–97)
Monocytes Absolute: 0.3 10*3/uL (ref 0.1–0.9)
Monocytes: 11 %
Neutrophils Absolute: 0.9 10*3/uL — ABNORMAL LOW (ref 1.4–7.0)
Neutrophils: 35 %
Platelets: 370 10*3/uL (ref 150–450)
RBC: 4.88 x10E6/uL (ref 3.77–5.28)
RDW: 16.3 % — ABNORMAL HIGH (ref 11.7–15.4)
WBC: 2.7 10*3/uL — ABNORMAL LOW (ref 3.4–10.8)

## 2019-12-24 LAB — IRON,TIBC AND FERRITIN PANEL
Ferritin: 29 ng/mL (ref 15–150)
Iron Saturation: 10 % — ABNORMAL LOW (ref 15–55)
Iron: 36 ug/dL (ref 27–159)
Total Iron Binding Capacity: 371 ug/dL (ref 250–450)
UIBC: 335 ug/dL (ref 131–425)

## 2020-01-01 ENCOUNTER — Ambulatory Visit (AMBULATORY_SURGERY_CENTER): Payer: Self-pay

## 2020-01-01 ENCOUNTER — Encounter: Payer: Self-pay | Admitting: Gastroenterology

## 2020-01-01 ENCOUNTER — Other Ambulatory Visit: Payer: Self-pay

## 2020-01-01 VITALS — Ht 63.75 in | Wt 164.0 lb

## 2020-01-01 DIAGNOSIS — D509 Iron deficiency anemia, unspecified: Secondary | ICD-10-CM

## 2020-01-01 DIAGNOSIS — Z1211 Encounter for screening for malignant neoplasm of colon: Secondary | ICD-10-CM

## 2020-01-01 MED ORDER — SUTAB 1479-225-188 MG PO TABS: 12.0000 | ORAL_TABLET | ORAL | 0 refills | Status: DC

## 2020-01-01 NOTE — Progress Notes (Signed)
No allergies to soy or egg Pt is not on blood thinners or diet pills Denies issues with sedation/intubation Denies atrial flutter/fib Denies constipation   Emmi instructions given to pt  Pt is aware of Covid safety and care partner requirements.  

## 2020-01-09 NOTE — Progress Notes (Signed)
° °  Subjective:    Patient ID: Sabrina Crane, female    DOB: January 07, 1971, 49 y.o.   MRN: [id number]  HPI Chief Complaint  Patient presents with   follow-up    follow-up    She is here to discuss abnormal labs.  This is a follow up on low WBC count.  Her white blood cell count dropped from 5.2 to 2.7 in one month.  Previously her platelets were elevated and they have since normalized.   Denies any concerns. She is doing her colonoscopy prep today.     Review of Systems Pertinent positives and negatives in the history of present illness.     Objective:   Physical Exam BP 120/68    Pulse 75    Wt 167 lb 3.2 oz (75.8 kg)    BMI 28.93 kg/m   Alert and oriented and in no acute distress.      Assessment & Plan:  Leukopenia, unspecified type - Plan: CBC with Differential/Platelet  Discussed that her platelets bounced back to normal but now she has leukopenia.  We will recheck her CBC and follow-up.  I wished her luck on her colonoscopy tomorrow.

## 2020-01-10 ENCOUNTER — Ambulatory Visit (INDEPENDENT_AMBULATORY_CARE_PROVIDER_SITE_OTHER): Payer: BC Managed Care – PPO | Admitting: Family Medicine

## 2020-01-10 ENCOUNTER — Other Ambulatory Visit: Payer: Self-pay

## 2020-01-10 ENCOUNTER — Encounter: Payer: Self-pay | Admitting: Family Medicine

## 2020-01-10 VITALS — BP 120/68 | HR 75 | Wt 167.2 lb

## 2020-01-10 DIAGNOSIS — D72819 Decreased white blood cell count, unspecified: Secondary | ICD-10-CM

## 2020-01-10 LAB — CBC WITH DIFFERENTIAL/PLATELET
Basophils Absolute: 0.1 10*3/uL (ref 0.0–0.2)
Basos: 1 %
EOS (ABSOLUTE): 0.1 10*3/uL (ref 0.0–0.4)
Eos: 2 %
Hematocrit: 33.2 % — ABNORMAL LOW (ref 34.0–46.6)
Hemoglobin: 9.9 g/dL — ABNORMAL LOW (ref 11.1–15.9)
Immature Grans (Abs): 0 10*3/uL (ref 0.0–0.1)
Immature Granulocytes: 0 %
Lymphocytes Absolute: 2.2 10*3/uL (ref 0.7–3.1)
Lymphs: 37 %
MCH: 23 pg — ABNORMAL LOW (ref 26.6–33.0)
MCHC: 29.8 g/dL — ABNORMAL LOW (ref 31.5–35.7)
MCV: 77 fL — ABNORMAL LOW (ref 79–97)
Monocytes Absolute: 0.5 10*3/uL (ref 0.1–0.9)
Monocytes: 9 %
Neutrophils Absolute: 3.1 10*3/uL (ref 1.4–7.0)
Neutrophils: 51 %
Platelets: 459 10*3/uL — ABNORMAL HIGH (ref 150–450)
RBC: 4.3 x10E6/uL (ref 3.77–5.28)
RDW: 16.4 % — ABNORMAL HIGH (ref 11.7–15.4)
WBC: 5.9 10*3/uL (ref 3.4–10.8)

## 2020-01-11 ENCOUNTER — Encounter: Payer: Self-pay | Admitting: Family Medicine

## 2020-01-13 ENCOUNTER — Ambulatory Visit (INDEPENDENT_AMBULATORY_CARE_PROVIDER_SITE_OTHER): Payer: BC Managed Care – PPO

## 2020-01-13 ENCOUNTER — Other Ambulatory Visit: Payer: Self-pay | Admitting: Gastroenterology

## 2020-01-13 DIAGNOSIS — Z1159 Encounter for screening for other viral diseases: Secondary | ICD-10-CM

## 2020-01-13 LAB — SARS CORONAVIRUS 2 (TAT 6-24 HRS): SARS Coronavirus 2: NEGATIVE

## 2020-01-15 ENCOUNTER — Encounter: Payer: Self-pay | Admitting: Gastroenterology

## 2020-01-15 ENCOUNTER — Ambulatory Visit (AMBULATORY_SURGERY_CENTER): Payer: BC Managed Care – PPO | Admitting: Gastroenterology

## 2020-01-15 ENCOUNTER — Other Ambulatory Visit: Payer: Self-pay

## 2020-01-15 VITALS — BP 103/59 | HR 65 | Temp 97.3°F | Resp 20 | Ht 64.0 in | Wt 164.0 lb

## 2020-01-15 DIAGNOSIS — Z1211 Encounter for screening for malignant neoplasm of colon: Secondary | ICD-10-CM

## 2020-01-15 MED ORDER — SODIUM CHLORIDE 0.9 % IV SOLN: 500.0000 mL | INTRAVENOUS | Status: DC

## 2020-01-15 NOTE — Patient Instructions (Signed)
Handouts given:  Hemorrhoids Resume previous diet Continue present medications Repeat colonoscopy in 10 years   YOU HAD AN ENDOSCOPIC PROCEDURE TODAY AT [facility]:   Refer to the procedure report that was given to you for any specific questions about what was found during the examination.  If the procedure report does not answer your questions, please call your gastroenterologist to clarify.  If you requested that your care partner not be given the details of your procedure findings, then the procedure report has been included in a sealed envelope for you to review at your convenience later.  YOU SHOULD EXPECT: Some feelings of bloating in the abdomen. Passage of more gas than usual.  Walking can help get rid of the air that was put into your GI tract during the procedure and reduce the bloating. If you had a lower endoscopy (such as a colonoscopy or flexible sigmoidoscopy) you may notice spotting of blood in your stool or on the toilet paper. If you underwent a bowel prep for your procedure, you may not have a normal bowel movement for a few days.  Please Note:  You might notice some irritation and congestion in your nose or some drainage.  This is from the oxygen used during your procedure.  There is no need for concern and it should clear up in a day or so.  SYMPTOMS TO REPORT IMMEDIATELY:   Following lower endoscopy (colonoscopy or flexible sigmoidoscopy):  Excessive amounts of blood in the stool  Significant tenderness or worsening of abdominal pains  Swelling of the abdomen that is new, acute  Fever of 100F or higher   For urgent or emergent issues, a gastroenterologist can be reached at any hour by calling [phone]. Do not use MyChart messaging for urgent concerns.    DIET:  We do recommend a small meal at first, but then you may proceed to your regular diet.  Drink plenty of fluids but you should avoid alcoholic beverages for 24 hours.  ACTIVITY:  You  should plan to take it easy for the rest of today and you should NOT DRIVE or use heavy machinery until tomorrow (because of the sedation medicines used during the test).    FOLLOW UP: Our staff will call the number listed on your records 48-72 hours following your procedure to check on you and address any questions or concerns that you may have regarding the information given to you following your procedure. If we do not reach you, we will leave a message.  We will attempt to reach you two times.  During this call, we will ask if you have developed any symptoms of COVID 19. If you develop any symptoms (ie: fever, flu-like symptoms, shortness of breath, cough etc.) before then, please call [phone].  If you test positive for Covid 19 in the 2 weeks post procedure, please call and report this information to [place].    If any biopsies were taken you will be contacted by phone or by letter within the next 1-3 weeks.  Please call us at [phone] if you have not heard about the biopsies in 3 weeks.    SIGNATURES/CONFIDENTIALITY: You and/or your care partner have signed paperwork which will be entered into your electronic medical record.  These signatures attest to the fact that that the information above on your After Visit Summary has been reviewed and is understood.  Full responsibility of the confidentiality of this discharge information lies with you and/or your care-partner.

## 2020-01-15 NOTE — Progress Notes (Signed)
Vs CW I have reviewed the patient's medical history in detail and updated the computerized patient record.   

## 2020-01-15 NOTE — Progress Notes (Signed)
PT taken to PACU. Monitors in place. VSS. Report given to RN. 

## 2020-01-15 NOTE — Op Note (Signed)
[place] Patient Name: Sabrina Crane Procedure Date: 01/15/2020 11:35 AM MRN: [id number] Endoscopist: Mauri Pole , MD Age: 49 Referring MD:  Date of Birth: 03/08/1971 Gender: Female Account #: [id number] Procedure:                Colonoscopy Indications:              Screening for colorectal malignant neoplasm Medicines:                Monitored Anesthesia Care Procedure:                Pre-Anesthesia Assessment:                           - Prior to the procedure, a History and Physical                            was performed, and patient medications and                            allergies were reviewed. The patient's tolerance of                            previous anesthesia was also reviewed. The risks                            and benefits of the procedure and the sedation                            options and risks were discussed with the patient.                            All questions were answered, and informed consent                            was obtained. Prior Anticoagulants: The patient has                            taken no previous anticoagulant or antiplatelet                            agents. ASA Grade Assessment: II - A patient with                            mild systemic disease. After reviewing the risks                            and benefits, the patient was deemed in                            satisfactory condition to undergo the procedure.                           After obtaining informed consent, the colonoscope  was passed under direct vision. Throughout the                            procedure, the patient's blood pressure, pulse, and                            oxygen saturations were monitored continuously. The                            Colonoscope was introduced through the anus and                            advanced to the the cecum, identified by                            appendiceal orifice  and ileocecal valve. The                            colonoscopy was performed without difficulty. The                            patient tolerated the procedure well. The quality                            of the bowel preparation was excellent. The                            ileocecal valve, appendiceal orifice, and rectum                            were photographed. Scope In: 11:46:02 AM Scope Out: 11:57:37 AM Scope Withdrawal Time: 0 hours 8 minutes 18 seconds  Total Procedure Duration: 0 hours 11 minutes 35 seconds  Findings:                 The perianal and digital rectal examinations were                            normal.                           Non-bleeding internal hemorrhoids were found during                            retroflexion. The hemorrhoids were small.                           The exam was otherwise without abnormality. Complications:            No immediate complications. Estimated Blood Loss:     Estimated blood loss was minimal. Impression:               - Non-bleeding internal hemorrhoids.                           - The examination was otherwise normal.                           -  No specimens collected. Recommendation:           - Patient has a contact number available for                            emergencies. The signs and symptoms of potential                            delayed complications were discussed with the                            patient. Return to normal activities tomorrow.                            Written discharge instructions were provided to the                            patient.                           - Resume previous diet.                           - Continue present medications.                           - Repeat colonoscopy in 10 years for screening                            purposes. Mauri Pole, MD 01/15/2020 12:02:03 PM This report has been signed electronically.

## 2020-01-17 ENCOUNTER — Telehealth: Payer: Self-pay

## 2020-01-17 NOTE — Telephone Encounter (Signed)
  Follow up Call-  Call back number 01/15/2020  Post procedure Call Back phone  # [phone]  Permission to leave phone message Yes     Patient questions:  Do you have a fever, pain , or abdominal swelling? No. Pain Score  0 *  Have you tolerated food without any problems? Yes.    Have you been able to return to your normal activities? Yes.    Do you have any questions about your discharge instructions: Diet   No. Medications  No. Follow up visit  No.  Do you have questions or concerns about your Care? No.  Actions: * If pain score is 4 or above: No action needed, pain <4.  1. Have you developed a fever since your procedure? no  2.   Have you had an respiratory symptoms (SOB or cough) since your procedure? no  3.   Have you tested positive for COVID 19 since your procedure no  4.   Have you had any family members/close contacts diagnosed with the COVID 19 since your procedure?  no   If yes to any of these questions please route to Joylene John, RN and Joella Prince, RN

## 2020-03-25 ENCOUNTER — Other Ambulatory Visit: Payer: Self-pay

## 2020-03-25 ENCOUNTER — Encounter: Payer: Self-pay | Admitting: Medical

## 2020-03-25 ENCOUNTER — Ambulatory Visit: Payer: BC Managed Care – PPO | Admitting: Medical

## 2020-03-25 VITALS — BP 132/80 | HR 76 | Ht 64.0 in | Wt 164.2 lb

## 2020-03-25 DIAGNOSIS — R87619 Unspecified abnormal cytological findings in specimens from cervix uteri: Secondary | ICD-10-CM

## 2020-03-25 DIAGNOSIS — R21 Rash and other nonspecific skin eruption: Secondary | ICD-10-CM

## 2020-03-25 DIAGNOSIS — D692 Other nonthrombocytopenic purpura: Secondary | ICD-10-CM

## 2020-03-25 DIAGNOSIS — D649 Anemia, unspecified: Secondary | ICD-10-CM | POA: Diagnosis not present

## 2020-03-25 DIAGNOSIS — N946 Dysmenorrhea, unspecified: Secondary | ICD-10-CM

## 2020-03-25 DIAGNOSIS — D75839 Thrombocytosis, unspecified: Secondary | ICD-10-CM

## 2020-03-25 NOTE — Progress Notes (Signed)
Subjective:  Sabrina Crane is a 49 y.o. female who presents for Chief Complaint  Patient presents with  . Leg Problem    red spot bilateral legs but not itching      Here for new skin spots on her legs times the last 2 weeks.  No prior similar.  The rash is not itchy.  She does note history of anemia.  She has heavy periods that are irregular.  She notes history of abnormal Pap smear last year and has not been back to gynecologist since her colposcopy but was due  She denies alcohol use.  Non-smoker.  No drugs.  She has Valtrex listed but does not take this daily  She just started back on iron therapy over-the-counter for the last 2 weeks.  She was on B12 supplement months ago but not currently taking this  No history in self or family of blood disorders otherwise or clotting disorders  No other aggravating or relieving factors.    No other c/o.  The following portions of the patient's history were reviewed and updated as appropriate: allergies, current medications, past family history, past medical history, past social history, past surgical history and problem list.  ROS Otherwise as in subjective above  Objective: BP 132/80   Pulse 76   Ht 5\' 4"  (1.626 m)   Wt 164 lb 3.2 oz (74.5 kg)   SpO2 98%   BMI 28.18 kg/m   General appearance: alert, no distress, well developed, well nourished HEENT: normocephalic, sclerae anicteric, conjunctiva pink and moist, TMs pearly, nares patent, no discharge or erythema, pharynx normal Oral cavity: MMM, no lesions Neck: supple, no lymphadenopathy, no thyromegaly, no masses Heart: RRR, normal S1, S2, no murmurs Lungs: CTA bilaterally, no wheezes, rhonchi, or rales Abdomen: +bs, soft, non tender, non distended, no masses, no hepatomegaly, no splenomegaly Pulses: 2+ radial pulses, 2+ pedal pulses, normal cap refill Ext: no edema Skin: Lower legs with scattered patches of erythema flat, mostly 7 mm or less diameter, several of these flat  lesions somewhat circular are patchy on bilateral ankles and lower legs scattered, nonblanching   Assessment: Encounter Diagnoses  Name Primary?  . Rash Yes  . Purpura ([place])   . Thrombocytosis   . Anemia, unspecified type   . Dysmenorrhea   . Abnormal cervical Papanicolaou smear, unspecified abnormal pap finding      Plan: We discussed possible differential which could include bleeding disorder, hematological disorder, anemia, ITP, allergic reaction or other.  Labs as below  Advise she go ahead and get back in with gynecology soon given the dysmenorrhea and abnormal Pap  I sent E consult and pictures through [facility] to consult on these findings.  We will await their response  Addendum:  [facility] consult notes:  HEMA Specialist #[id number] 1:38 PM Hello, thank you for the opportunity to review this case. Your patient has purpuric rash There is a prior history of thrombocytosis which can be secondary to iron deficiency if confirmed, reactive process such as vasculitis, or myeloproliferative disorder. In terms of lesions, they can be due to hematologic disorder (thrombocytopenia, platelet dysfunction in the setting of myeloproliferarive disorder, coagulopathy) or vasculitis. I agree with starting with CBC, sed rate, ANA, would also check PT/PTT, fibrinogen, C3/C4 complement. If there is evidence of iron deficiency would replete iron and refer for GI workup. If there is no evidence of iron deficiency and platelets are persistently elevated patient will need evaluation for myeloproliferative disorder - happy to follow along and  answer follow up questions.  After reviewing the consult notes, consider adding coags, C3/C4 complement, fibrinogen  Tayley was seen today for leg problem.  Diagnoses and all orders for this visit:  Rash -     CBC -     Sedimentation rate -     ANA -     Folate  Purpura ([facility]) -     CBC -     Sedimentation rate -     ANA -      Folate  Thrombocytosis -     CBC -     Sedimentation rate -     ANA -     Folate  Anemia, unspecified type -     CBC -     Sedimentation rate -     ANA -     Folate  Dysmenorrhea -     CBC -     Sedimentation rate -     ANA -     Folate  Abnormal cervical Papanicolaou smear, unspecified abnormal pap finding -     CBC -     Sedimentation rate -     ANA -     Folate    Follow up: pending labs

## 2020-03-26 ENCOUNTER — Telehealth: Payer: Self-pay

## 2020-03-26 ENCOUNTER — Telehealth: Payer: Self-pay | Admitting: Internal Medicine

## 2020-03-26 ENCOUNTER — Other Ambulatory Visit: Payer: Self-pay | Admitting: Medical

## 2020-03-26 DIAGNOSIS — D692 Other nonthrombocytopenic purpura: Secondary | ICD-10-CM

## 2020-03-26 DIAGNOSIS — D509 Iron deficiency anemia, unspecified: Secondary | ICD-10-CM

## 2020-03-26 DIAGNOSIS — N939 Abnormal uterine and vaginal bleeding, unspecified: Secondary | ICD-10-CM

## 2020-03-26 DIAGNOSIS — D75839 Thrombocytosis, unspecified: Secondary | ICD-10-CM

## 2020-03-26 DIAGNOSIS — L989 Disorder of the skin and subcutaneous tissue, unspecified: Secondary | ICD-10-CM

## 2020-03-26 LAB — CBC
Hematocrit: 35.3 % (ref 34.0–46.6)
Hemoglobin: 10.5 g/dL — ABNORMAL LOW (ref 11.1–15.9)
MCH: 22.9 pg — ABNORMAL LOW (ref 26.6–33.0)
MCHC: 29.7 g/dL — ABNORMAL LOW (ref 31.5–35.7)
MCV: 77 fL — ABNORMAL LOW (ref 79–97)
Platelets: 453 10*3/uL — ABNORMAL HIGH (ref 150–450)
RBC: 4.59 x10E6/uL (ref 3.77–5.28)
RDW: 15.7 % — ABNORMAL HIGH (ref 11.7–15.4)
WBC: 7.3 10*3/uL (ref 3.4–10.8)

## 2020-03-26 LAB — ANA: Anti Nuclear Antibody (ANA): NEGATIVE

## 2020-03-26 LAB — FOLATE: Folate: 4.3 ng/mL (ref >3.0)

## 2020-03-26 LAB — SEDIMENTATION RATE: Sed Rate: 32 mm/hr (ref 0–40)

## 2020-03-26 NOTE — Telephone Encounter (Signed)
Pt called concerning her visit yesterday. Pt was advised to follow Audelia [facility] directions and contact her OB/gyn and if she could not get there soon and problem gets worse to call back to our office to be seen. [place]

## 2020-03-26 NOTE — Telephone Encounter (Signed)
Pt called and is coming in on 11/12 for additional labs but she wants to know what those labs mean. I advised her that hematology recommended these   Per vickie. These labs are to determine possibly what is causing a rash and not to panic yet. Pt is trying to get in with obgyn

## 2020-03-27 ENCOUNTER — Other Ambulatory Visit: Payer: Self-pay

## 2020-03-27 ENCOUNTER — Other Ambulatory Visit: Payer: BC Managed Care – PPO

## 2020-03-27 DIAGNOSIS — D75839 Thrombocytosis, unspecified: Secondary | ICD-10-CM

## 2020-03-27 DIAGNOSIS — L989 Disorder of the skin and subcutaneous tissue, unspecified: Secondary | ICD-10-CM

## 2020-03-27 DIAGNOSIS — D509 Iron deficiency anemia, unspecified: Secondary | ICD-10-CM

## 2020-03-27 DIAGNOSIS — N939 Abnormal uterine and vaginal bleeding, unspecified: Secondary | ICD-10-CM

## 2020-03-28 LAB — PT AND PTT
INR: 1 (ref 0.9–1.2)
Prothrombin Time: 10.3 s (ref 9.1–12.0)
aPTT: 25 s (ref 24–33)

## 2020-03-28 LAB — FIBRINOGEN: Fibrinogen: 372 mg/dL (ref 193–507)

## 2020-03-28 LAB — C3 COMPLEMENT: Complement C3, Serum: 145 mg/dL (ref 82–167)

## 2020-03-28 LAB — C4 COMPLEMENT: Complement C4, Serum: 29 mg/dL (ref 12–38)

## 2020-03-30 ENCOUNTER — Telehealth: Payer: Self-pay | Admitting: Family Medicine

## 2020-03-30 NOTE — Telephone Encounter (Signed)
Pt called and states that Sabrina Crane drew labs on her and advised that she needed to follow up with her OBGYN. She is requesting you also look at labs and see what you think. Please advise pt at [phone].

## 2020-03-30 NOTE — Telephone Encounter (Signed)
I agree with Shane's work up and recommendation. She is in good hands. I also recommend she follow up with her gynecologist.

## 2020-03-31 NOTE — Telephone Encounter (Signed)
Pt was called and advised. She wanted me to let you know that she can not get in with her OBGYN until 05/04/2020

## 2020-05-04 LAB — HM PAP SMEAR: HM Pap smear: NEGATIVE

## 2020-05-11 ENCOUNTER — Other Ambulatory Visit: Payer: Self-pay | Admitting: Family Medicine

## 2020-05-11 DIAGNOSIS — I10 Essential (primary) hypertension: Secondary | ICD-10-CM

## 2020-05-20 ENCOUNTER — Encounter: Payer: Self-pay | Admitting: Family Medicine

## 2020-05-20 ENCOUNTER — Ambulatory Visit (INDEPENDENT_AMBULATORY_CARE_PROVIDER_SITE_OTHER): Payer: BC Managed Care – PPO | Admitting: Family Medicine

## 2020-05-20 ENCOUNTER — Other Ambulatory Visit: Payer: Self-pay

## 2020-05-20 VITALS — BP 120/70 | HR 72 | Resp 16 | Ht 64.0 in | Wt 166.8 lb

## 2020-05-20 DIAGNOSIS — D509 Iron deficiency anemia, unspecified: Secondary | ICD-10-CM | POA: Diagnosis not present

## 2020-05-20 DIAGNOSIS — Z23 Encounter for immunization: Secondary | ICD-10-CM | POA: Diagnosis not present

## 2020-05-20 DIAGNOSIS — G4700 Insomnia, unspecified: Secondary | ICD-10-CM

## 2020-05-20 DIAGNOSIS — D75839 Thrombocytosis, unspecified: Secondary | ICD-10-CM

## 2020-05-20 DIAGNOSIS — I10 Essential (primary) hypertension: Secondary | ICD-10-CM

## 2020-05-20 NOTE — Patient Instructions (Signed)
Remember to practice good sleep hygiene.  Sleep in a dark, cool and quiet room. No TV or phone in the bed or even 30 minutes before bedtime.  Cut off your caffeine earlier in the day. Do not eat 2 to 3 hours before you go to bed.  If you are not asleep after 30 minutes, get up and do something until you feel sleepy.  You may also try melatonin over-the-counter 3 mg and then increase to 6 mg if that does not work after 2 or 3 nights.  I will be in touch with your lab results.

## 2020-05-20 NOTE — Progress Notes (Signed)
   Subjective:    Patient ID: Sabrina Crane, female    DOB: [birth year], 50 y.o.   MRN: [id number]  HPI Chief Complaint  Patient presents with  . Hypertension    6 month follow up. Patient is fasting and has no concerns. Patient will take the Tdap today.    She is here to follow up on anemia, thrombocytosis and HTN. She has a new concern today regarding insomnia. States she has difficulty falling asleep. This has been ongoing for months.   Closely followed by OB/GYN Eveline Keto at [facility], but will be seeing someone new since she is retiring. Being followed for heavy and frequent menstrual cycles, anemia and hematuria.  She started on hormone therapy last week.  Dr. Gena Fray will be her new OB/GYN.   Dr. Lavon Paganini is her GI and last colonoscopy on 01/15/2020   HTN- taking medication daily without any issues.   Denies fever, chills, dizziness, chest pain, palpitations, shortness of breath, abdominal pain, N/V/D, urinary symptoms, LE edema.   Reviewed allergies, medications, past medical, surgical, family, and social history.   Review of Systems Pertinent positives and negatives in the history of present illness.     Objective:   Physical Exam BP 120/70   Pulse 72   Resp 16   Ht 5\' 4"  (1.626 m)   Wt 166 lb 12.8 oz (75.7 kg)   LMP 05/13/2020   BMI 28.63 kg/m   Alert and in no distress. Cardiac exam shows a regular sinus rhythm without murmurs or gallops. Lungs are clear to auscultation. Skin is warm and dry, no pallor or rash.       Assessment & Plan:  Iron deficiency anemia, unspecified iron deficiency anemia type - Plan: CBC with Differential/Platelet, Comprehensive metabolic panel, Iron, TIBC and Ferritin Panel -Discussed that this is most likely related to her history of cycles and she is seeing OB/GYN for this.  She would like to have labs today.  I will follow-up pending results.  Thrombocytosis - Plan: CBC with  Differential/Platelet -This has been improving and basically back to normal.  We will check this again today.  Essential hypertension - Plan: CBC with Differential/Platelet, Comprehensive metabolic panel -Blood pressure well controlled.  Continue current medication.  Continue low-sodium diet and I recommend regular exercise.  Insomnia, unspecified type - Plan: Comprehensive metabolic panel, TSH, T4, free, T3 -In-depth counseling on good sleep hygiene she needs to retrain herself to sleep again.  She may also try melatonin.  Follow-up if not improving  Need for Tdap vaccination - Plan: Tdap vaccine greater than or equal to 7yo IM -Counseling done on all components of this vaccine.

## 2020-05-21 ENCOUNTER — Encounter: Payer: Self-pay | Admitting: Family Medicine

## 2020-05-21 ENCOUNTER — Other Ambulatory Visit: Payer: Self-pay | Admitting: Obstetrics and Gynecology

## 2020-05-21 DIAGNOSIS — N6310 Unspecified lump in the right breast, unspecified quadrant: Secondary | ICD-10-CM

## 2020-05-21 LAB — COMPREHENSIVE METABOLIC PANEL
ALT: 17 IU/L (ref 0–32)
AST: 14 IU/L (ref 0–40)
Albumin/Globulin Ratio: 1.7 (ref 1.2–2.2)
Albumin: 4.8 g/dL (ref 3.8–4.9)
Alkaline Phosphatase: 64 IU/L (ref 44–121)
BUN/Creatinine Ratio: 20 (ref 9–23)
BUN: 15 mg/dL (ref 6–24)
Bilirubin Total: <0.2 mg/dL (ref 0.0–1.2)
CO2: 23 mmol/L (ref 20–29)
Calcium: 9.3 mg/dL (ref 8.7–10.2)
Chloride: 99 mmol/L (ref 96–106)
Creatinine, Ser: 0.75 mg/dL (ref 0.57–1.00)
GFR calc Af Amer: 107 mL/min/{1.73_m2} (ref >59)
GFR calc non Af Amer: 93 mL/min/{1.73_m2} (ref >59)
Globulin, Total: 2.9 g/dL (ref 1.5–4.5)
Glucose: 91 mg/dL (ref 65–99)
Potassium: 4.1 mmol/L (ref 3.5–5.2)
Sodium: 138 mmol/L (ref 134–144)
Total Protein: 7.7 g/dL (ref 6.0–8.5)

## 2020-05-21 LAB — CBC WITH DIFFERENTIAL/PLATELET
Basophils Absolute: 0.1 10*3/uL (ref 0.0–0.2)
Basos: 1 %
EOS (ABSOLUTE): 0.2 10*3/uL (ref 0.0–0.4)
Eos: 3 %
Hematocrit: 38.2 % (ref 34.0–46.6)
Hemoglobin: 11.9 g/dL (ref 11.1–15.9)
Immature Grans (Abs): 0 10*3/uL (ref 0.0–0.1)
Immature Granulocytes: 0 %
Lymphocytes Absolute: 2.3 10*3/uL (ref 0.7–3.1)
Lymphs: 39 %
MCH: 23.7 pg — ABNORMAL LOW (ref 26.6–33.0)
MCHC: 31.2 g/dL — ABNORMAL LOW (ref 31.5–35.7)
MCV: 76 fL — ABNORMAL LOW (ref 79–97)
Monocytes Absolute: 0.4 10*3/uL (ref 0.1–0.9)
Monocytes: 6 %
Neutrophils Absolute: 3 10*3/uL (ref 1.4–7.0)
Neutrophils: 51 %
Platelets: 477 10*3/uL — ABNORMAL HIGH (ref 150–450)
RBC: 5.03 x10E6/uL (ref 3.77–5.28)
RDW: 15.5 % — ABNORMAL HIGH (ref 11.7–15.4)
WBC: 5.9 10*3/uL (ref 3.4–10.8)

## 2020-05-21 LAB — IRON,TIBC AND FERRITIN PANEL
Ferritin: 34 ng/mL (ref 15–150)
Iron Saturation: 43 % (ref 15–55)
Iron: 155 ug/dL (ref 27–159)
Total Iron Binding Capacity: 359 ug/dL (ref 250–450)
UIBC: 204 ug/dL (ref 131–425)

## 2020-05-21 LAB — T3: T3, Total: 139 ng/dL (ref 71–180)

## 2020-05-21 LAB — T4, FREE: Free T4: 1.01 ng/dL (ref 0.82–1.77)

## 2020-05-21 LAB — TSH: TSH: 1.04 u[IU]/mL (ref 0.450–4.500)

## 2020-05-21 NOTE — Progress Notes (Signed)
Please see if she still wants the referral to hematology for thrombocytosis.

## 2020-05-22 ENCOUNTER — Other Ambulatory Visit: Payer: Self-pay | Admitting: Internal Medicine

## 2020-05-22 DIAGNOSIS — D75839 Thrombocytosis, unspecified: Secondary | ICD-10-CM

## 2020-06-03 ENCOUNTER — Ambulatory Visit
Admission: RE | Admit: 2020-06-03 | Discharge: 2020-06-03 | Disposition: A | Discharge status: Home / Self Care | Payer: BC Managed Care – PPO | Source: Ambulatory Visit | Attending: Obstetrics and Gynecology | Admitting: Obstetrics and Gynecology

## 2020-06-03 ENCOUNTER — Other Ambulatory Visit: Payer: Self-pay | Admitting: Obstetrics and Gynecology

## 2020-06-03 ENCOUNTER — Other Ambulatory Visit: Payer: Self-pay

## 2020-06-03 DIAGNOSIS — N6310 Unspecified lump in the right breast, unspecified quadrant: Secondary | ICD-10-CM

## 2020-06-04 ENCOUNTER — Encounter: Payer: Self-pay | Admitting: Hematology and Oncology

## 2020-06-04 ENCOUNTER — Other Ambulatory Visit: Payer: Self-pay

## 2020-06-04 ENCOUNTER — Inpatient Hospital Stay: Payer: BC Managed Care – PPO

## 2020-06-04 ENCOUNTER — Inpatient Hospital Stay: Payer: BC Managed Care – PPO | Attending: Hematology and Oncology | Admitting: Hematology and Oncology

## 2020-06-04 VITALS — BP 118/75 | HR 75 | Temp 96.0°F | Resp 17 | Ht 64.0 in | Wt 166.5 lb

## 2020-06-04 DIAGNOSIS — R5383 Other fatigue: Secondary | ICD-10-CM | POA: Diagnosis not present

## 2020-06-04 DIAGNOSIS — D500 Iron deficiency anemia secondary to blood loss (chronic): Secondary | ICD-10-CM

## 2020-06-04 DIAGNOSIS — Z79899 Other long term (current) drug therapy: Secondary | ICD-10-CM | POA: Insufficient documentation

## 2020-06-04 DIAGNOSIS — Z7982 Long term (current) use of aspirin: Secondary | ICD-10-CM | POA: Insufficient documentation

## 2020-06-04 DIAGNOSIS — D75839 Thrombocytosis, unspecified: Secondary | ICD-10-CM

## 2020-06-04 DIAGNOSIS — N920 Excessive and frequent menstruation with regular cycle: Secondary | ICD-10-CM | POA: Diagnosis not present

## 2020-06-04 DIAGNOSIS — R42 Dizziness and giddiness: Secondary | ICD-10-CM | POA: Insufficient documentation

## 2020-06-04 DIAGNOSIS — I10 Essential (primary) hypertension: Secondary | ICD-10-CM | POA: Insufficient documentation

## 2020-06-04 DIAGNOSIS — B009 Herpesviral infection, unspecified: Secondary | ICD-10-CM | POA: Insufficient documentation

## 2020-06-04 DIAGNOSIS — R0602 Shortness of breath: Secondary | ICD-10-CM | POA: Diagnosis not present

## 2020-06-04 LAB — CMP (CANCER CENTER ONLY)
ALT: 17 U/L (ref 0–44)
AST: 12 U/L — ABNORMAL LOW (ref 15–41)
Albumin: 3.9 g/dL (ref 3.5–5.0)
Alkaline Phosphatase: 54 U/L (ref 38–126)
Anion gap: 8 (ref 5–15)
BUN: 9 mg/dL (ref 6–20)
CO2: 28 mmol/L (ref 22–32)
Calcium: 9.3 mg/dL (ref 8.9–10.3)
Chloride: 101 mmol/L (ref 98–111)
Creatinine: 0.76 mg/dL (ref 0.44–1.00)
GFR, Estimated: >60 mL/min (ref >60)
Glucose, Bld: 85 mg/dL (ref 70–99)
Potassium: 3.7 mmol/L (ref 3.5–5.1)
Sodium: 137 mmol/L (ref 135–145)
Total Bilirubin: 0.3 mg/dL (ref 0.3–1.2)
Total Protein: 7.7 g/dL (ref 6.5–8.1)

## 2020-06-04 LAB — IRON AND TIBC
Iron: 57 ug/dL (ref 41–142)
Saturation Ratios: 14 % — ABNORMAL LOW (ref 21–57)
TIBC: 405 ug/dL (ref 236–444)
UIBC: 349 ug/dL (ref 120–384)

## 2020-06-04 LAB — FERRITIN: Ferritin: 26 ng/mL (ref 11–307)

## 2020-06-04 LAB — CBC WITH DIFFERENTIAL (CANCER CENTER ONLY)
Abs Immature Granulocytes: 0.01 10*3/uL (ref 0.00–0.07)
Basophils Absolute: 0.1 10*3/uL (ref 0.0–0.1)
Basophils Relative: 1 %
Eosinophils Absolute: 0.1 10*3/uL (ref 0.0–0.5)
Eosinophils Relative: 2 %
HCT: 35.7 % — ABNORMAL LOW (ref 36.0–46.0)
Hemoglobin: 11.3 g/dL — ABNORMAL LOW (ref 12.0–15.0)
Immature Granulocytes: 0 %
Lymphocytes Relative: 35 %
Lymphs Abs: 2.1 10*3/uL (ref 0.7–4.0)
MCH: 23.6 pg — ABNORMAL LOW (ref 26.0–34.0)
MCHC: 31.7 g/dL (ref 30.0–36.0)
MCV: 74.5 fL — ABNORMAL LOW (ref 80.0–100.0)
Monocytes Absolute: 0.4 10*3/uL (ref 0.1–1.0)
Monocytes Relative: 7 %
Neutro Abs: 3.4 10*3/uL (ref 1.7–7.7)
Neutrophils Relative %: 55 %
Platelet Count: 404 10*3/uL — ABNORMAL HIGH (ref 150–400)
RBC: 4.79 MIL/uL (ref 3.87–5.11)
RDW: 15.4 % (ref 11.5–15.5)
WBC Count: 6.1 10*3/uL (ref 4.0–10.5)
nRBC: 0 % (ref 0.0–0.2)

## 2020-06-04 LAB — RETIC PANEL
Immature Retic Fract: 23.4 % — ABNORMAL HIGH (ref 2.3–15.9)
RBC.: 4.78 MIL/uL (ref 3.87–5.11)
Retic Count, Absolute: 115.7 10*3/uL (ref 19.0–186.0)
Retic Ct Pct: 2.4 % (ref 0.4–3.1)
Reticulocyte Hemoglobin: 27.6 pg — ABNORMAL LOW (ref >27.9)

## 2020-06-04 NOTE — Progress Notes (Signed)
[place] [phone]   Fax:([phone]) [place] NOTE  Patient Care Team: Girtha Rm, NP-C as PCP - General (Family Medicine)  Hematological/Oncological History # Thrombocytosis # Iron Deficiency Anemia 2/2 to GYN Bleeding 07/18/2019: WBC 5.6, Hgb 10.5, MCV 76, Plt 462 03/25/2020: WBC 7.3, Hgb 10.5, MCV 77, Plt 453 06/05/2019: establish care with Dr. Lorenso Courier   CHIEF COMPLAINTS/PURPOSE OF CONSULTATION:  "Thrombocytosis "  HISTORY OF PRESENTING ILLNESS:  Sabrina Crane 50 y.o. female with medical history significant for hypertension, and iron deficiency anemia who presents for evaluation of her thrombocytosis.  On review of the previous records Sabrina Crane moved to [facility] in March 2021 and establish care with a primary care provider.  On her initial lab check on 07/18/2019 the patient was found to have a hemoglobin of 10.5, MCV of 76, and a platelet count of 462.  At local maximum she had a platelet count of 516 on 11/19/2019.  On her most recent evaluation on 05/20/2020 the patient had a platelet count of 477 with a hemoglobin of 11.9 and an MCV of 76.  Due to concern for this thrombocytosis the patient was referred to hematology for further evaluation and management.  On exam today Sabrina Crane reports that she does continue to have heavy periods.  She reports that she has connected with OB/GYN and they are starting therapy in order to help reduce her menstrual cycles.  She notes that her menstrual cycles typically last approximately 7 days and occasionally she can have 2 periods per month.  She notes her last menstrual cycle was approximately 6 days ago.  She reports that on her heaviest days she changed pads approximately every 30 minutes.  She is currently taking over-the-counter iron with "good nature" iron pills.  She reports that she does have some issues with fatigue, tiredness, shortness of breath, dizziness, and she does crave ice.  She notes  that other iron formulations she has been on the past have caused her constipation.  Also she is concerned that she was told she had blood in her urine and was being evaluated by urology in February for this.  She denies any other overt signs of bleeding, bruising, or dark stools.  A full 10 point ROS is listed below.  MEDICAL HISTORY:  Past Medical History:  Diagnosis Date  . Anemia   . Genital herpes   . Hypertension   . IDA (iron deficiency anemia) 11/12/2019  . Thrombocytosis 11/20/2019    SURGICAL HISTORY: Past Surgical History:  Procedure Laterality Date  . CERVICAL BIOPSY  W/ LOOP ELECTRODE EXCISION    . CHOLECYSTECTOMY    . TONSILLECTOMY AND ADENOIDECTOMY    . TUBAL LIGATION      SOCIAL HISTORY: Social History   Socioeconomic History  . Marital status: Married    Spouse name: Not on file  . Number of children: 4  . Years of education: Not on file  . Highest education level: Not on file  Occupational History  . Occupation: Scientist, water quality  Tobacco Use  . Smoking status: Never Smoker  . Smokeless tobacco: Never Used  Vaping Use  . Vaping Use: Never used  Substance and Sexual Activity  . Alcohol use: Never  . Drug use: Never  . Sexual activity: Yes    Birth control/protection: Surgical  Other Topics Concern  . Not on file  Social History Narrative  . Not on file   Social Determinants of Health   Financial Resource Strain:  Not on file  Food Insecurity: Not on file  Transportation Needs: Not on file  Physical Activity: Not on file  Stress: Not on file  Social Connections: Not on file  Intimate Partner Violence: Not on file    FAMILY HISTORY: Family History  Problem Relation Age of Onset  . Cancer Sister        cholangiocarcinoma   . Diabetes Mother   . Hypertension Mother   . Diabetes Father   . Kidney disease Half-Brother   . Diabetes Half-Brother   . Liver disease Half-Brother   . HIV Half-Brother   . Colon cancer Maternal Uncle   . Colon polyps Neg Hx    . Esophageal cancer Neg Hx   . Rectal cancer Neg Hx   . Stomach cancer Neg Hx     ALLERGIES:  is allergic to latex.  MEDICATIONS:  Current Outpatient Medications  Medication Sig Dispense Refill  . ferrous sulfate 325 (65 FE) MG tablet Take 325 mg by mouth daily with breakfast.    . ASPIRIN PO Take by mouth. (Patient not taking: Reported on 05/20/2020)    . estradiol-norethindrone (ACTIVELLA) 1-0.5 MG tablet Take 1 tablet by mouth daily.    Marland Kitchen losartan-hydrochlorothiazide (HYZAAR) 50-12.5 MG tablet Take 1 tablet by mouth once daily 90 tablet 0  . valACYclovir (VALTREX) 1000 MG tablet Take 2 tablets (2,000 mg total) by mouth 2 (two) times daily. X 1 day at onset of outbreak (Patient not taking: No sig reported) 30 tablet 2   No current facility-administered medications for this visit.    REVIEW OF SYSTEMS:   Constitutional: ( - ) fevers, ( - )  chills , ( - ) night sweats Eyes: ( - ) blurriness of vision, ( - ) double vision, ( - ) watery eyes Ears, nose, mouth, throat, and face: ( - ) mucositis, ( - ) sore throat Respiratory: ( - ) cough, ( - ) dyspnea, ( - ) wheezes Cardiovascular: ( - ) palpitation, ( - ) chest discomfort, ( - ) lower extremity swelling Gastrointestinal:  ( - ) nausea, ( - ) heartburn, ( - ) change in bowel habits Skin: ( - ) abnormal skin rashes Lymphatics: ( - ) new lymphadenopathy, ( - ) easy bruising Neurological: ( - ) numbness, ( - ) tingling, ( - ) new weaknesses Behavioral/Psych: ( - ) mood change, ( - ) new changes  All other systems were reviewed with the patient and are negative.  PHYSICAL EXAMINATION: Vitals:   06/04/20 0921  BP: 118/75  Pulse: 75  Resp: 17  Temp: (!) 96 F (35.6 C)  SpO2: 100%   Filed Weights   06/04/20 0921  Weight: 166 lb 8 oz (75.5 kg)    GENERAL: well appearing middle aged [place] American female in NAD  SKIN: skin color, texture, turgor are normal, no rashes or significant lesions EYES: conjunctiva are pink and  non-injected, sclera clear LUNGS: clear to auscultation and percussion with normal breathing effort HEART: regular rate & rhythm and no murmurs and no lower extremity edema Musculoskeletal: no cyanosis of digits and no clubbing  PSYCH: alert & oriented x 3, fluent speech NEURO: no focal motor/sensory deficits  LABORATORY DATA:  I have reviewed the data as listed CBC Latest Ref Rng & Units 06/04/2020 05/20/2020 03/25/2020  WBC 4.0 - 10.5 K/uL 6.1 5.9 7.3  Hemoglobin 12.0 - 15.0 g/dL 11.3(L) 11.9 10.5(L)  Hematocrit 36.0 - 46.0 % 35.7(L) 38.2 35.3  Platelets 150 - 400  K/uL 404(H) 477(H) 453(H)    CMP Latest Ref Rng & Units 06/04/2020 05/20/2020 11/19/2019  Glucose 70 - 99 mg/dL 85 91 100(H)  BUN 6 - 20 mg/dL 9 15 14   Creatinine 0.44 - 1.00 mg/dL 0.76 0.75 0.84  Sodium 135 - 145 mmol/L 137 138 140  Potassium 3.5 - 5.1 mmol/L 3.7 4.1 3.8  Chloride 98 - 111 mmol/L 101 99 99  CO2 22 - 32 mmol/L 28 23 25   Calcium 8.9 - 10.3 mg/dL 9.3 9.3 9.7  Total Protein 6.5 - 8.1 g/dL 7.7 7.7 7.8  Total Bilirubin 0.3 - 1.2 mg/dL 0.3 <0.2 0.3  Alkaline Phos 38 - 126 U/L 54 64 64  AST 15 - 41 U/L 12(L) 14 15  ALT 0 - 44 U/L 17 17 12     RADIOGRAPHIC STUDIES: US BREAST LTD UNI RIGHT INC AXILLA  Result Date: 06/03/2020 CLINICAL DATA:  50 year old female recalled from screening mammogram dated 05/04/2020 for possible right breast masses. EXAM: DIGITAL DIAGNOSTIC RIGHT MAMMOGRAM WITH CAD AND TOMOSYNTHESIS ULTRASOUND BREAST RIGHT TECHNIQUE: Right digital diagnostic mammography and breast tomosynthesis was performed. Digital images of the right breast were evaluated with computer-aided detection. Targeted ultrasound examination of the right breast was performed. COMPARISON:  Previous exam(s). ACR Breast Density Category c: The breast tissue is heterogeneously dense, which may obscure small masses. FINDINGS: There are 2 persistent oval, circumscribed equal density masses in the upper-outer quadrant of the right breast.  Further evaluation with ultrasound was performed. Targeted ultrasound is performed, showing an oval, circumscribed multi-cystic mass at the 10 o'clock position 4 cm from the nipple. It measures 1.8 x 0.9 x 0.8 cm. There is no internal vascularity. This correlates with the larger of the 2 mammographically identified masses. A second oval, circumscribed anechoic mass is identified at the 10 o'clock position 4 cm from the nipple. It measures 0.8 x 0.6 x 0.5 cm. There is no internal vascularity. This correlates with the second mammographically identified mass. IMPRESSION: 1. Probably benign, probable cluster of cysts at the 10 o'clock position 4 cm from the nipple. Recommendation is for short-term interval follow-up. 2. Additional benign, simple cyst at the 10 o'clock position 4 cm from the nipple. No further imaging follow-up required. RECOMMENDATION: Right breast ultrasound in 6 months. I have discussed the findings and recommendations with the patient. If applicable, a reminder letter will be sent to the patient regarding the next appointment. BI-RADS CATEGORY  3: Probably benign. Electronically Signed   By: Kristopher Oppenheim M.D.   On: 06/03/2020 10:52   MM DIAG BREAST TOMO UNI RIGHT  Result Date: 06/03/2020 CLINICAL DATA:  50 year old female recalled from screening mammogram dated 05/04/2020 for possible right breast masses. EXAM: DIGITAL DIAGNOSTIC RIGHT MAMMOGRAM WITH CAD AND TOMOSYNTHESIS ULTRASOUND BREAST RIGHT TECHNIQUE: Right digital diagnostic mammography and breast tomosynthesis was performed. Digital images of the right breast were evaluated with computer-aided detection. Targeted ultrasound examination of the right breast was performed. COMPARISON:  Previous exam(s). ACR Breast Density Category c: The breast tissue is heterogeneously dense, which may obscure small masses. FINDINGS: There are 2 persistent oval, circumscribed equal density masses in the upper-outer quadrant of the right breast. Further  evaluation with ultrasound was performed. Targeted ultrasound is performed, showing an oval, circumscribed multi-cystic mass at the 10 o'clock position 4 cm from the nipple. It measures 1.8 x 0.9 x 0.8 cm. There is no internal vascularity. This correlates with the larger of the 2 mammographically identified masses. A second oval, circumscribed anechoic mass is  identified at the 10 o'clock position 4 cm from the nipple. It measures 0.8 x 0.6 x 0.5 cm. There is no internal vascularity. This correlates with the second mammographically identified mass. IMPRESSION: 1. Probably benign, probable cluster of cysts at the 10 o'clock position 4 cm from the nipple. Recommendation is for short-term interval follow-up. 2. Additional benign, simple cyst at the 10 o'clock position 4 cm from the nipple. No further imaging follow-up required. RECOMMENDATION: Right breast ultrasound in 6 months. I have discussed the findings and recommendations with the patient. If applicable, a reminder letter will be sent to the patient regarding the next appointment. BI-RADS CATEGORY  3: Probably benign. Electronically Signed   By: Kristopher Oppenheim M.D.   On: 06/03/2020 10:52    ASSESSMENT & PLAN Sabrina Crane 50 y.o. female with medical history significant for hypertension, and iron deficiency anemia who presents for evaluation of her thrombocytosis.  After review the labs, review the records, and discussion with the patient the findings are most consistent with a thrombocytosis secondary to iron deficiency anemia.  The patient has heavy menstrual cycles and it is likely that she has not rebleeding her iron adequately with p.o. therapy.  As such I do think she would be an excellent candidate for IV iron therapy.  We will plan for IV Feraheme q. 7 days x 2 doses in order to bolster her levels if she is found to have continued iron deficiency despite p.o. therapy.  Additionally we will order CBC in order to assure that her hemoglobin and platelet  levels are improving.  We will plan to have the patient come back approximately 4 to 6 weeks after her last dose of IV iron.  # Thrombocytosis # Iron Deficiency Anemia 2/2 to GYN Bleeding -- At this time the patient's thrombocytosis appears to be most consistent with her iron deficiency anemia. -- Sabrina Crane is currently taking over-the-counter iron therapy with an equivalent of 65 mg of elemental iron.  She is tolerating this well. -- Would recommend evaluation of the patient's iron studies today in order to assure that she is absorbing the iron she needs in order to appropriately treat her anemia. -- If the patient is found to have continued low iron levels that are not trending up appropriately and continued thrombocytosis I would recommend we consider IV iron therapy instead. -- The preferred choice in this therapy would be IV Feraheme 510 mg q. 7 days x 2 doses. -- Return to clinic for IV iron and a return dose approximately 4 to 6 weeks after the last dose  Orders Placed This Encounter  Procedures  . CBC with Differential (Can[facility] Center Only)    Standing Status:   Future    Number of Occurrences:   1    Standing Expiration Date:   06/04/2021  . Retic Panel    Standing Status:   Future    Number of Occurrences:   1    Standing Expiration Date:   06/04/2021  . CMP ([place] only)    Standing Status:   Future    Number of Occurrences:   1    Standing Expiration Date:   06/04/2021  . Ferritin    Standing Status:   Future    Number of Occurrences:   1    Standing Expiration Date:   06/04/2021  . Iron and TIBC    Standing Status:   Future    Number of Occurrences:   1    Standing  Expiration Date:   06/04/2021    All questions were answered. The patient knows to call the clinic with any problems, questions or concerns.  A total of more than 60 minutes were spent on this encounter and over half of that time was spent on counseling and coordination of care as outlined above.    Ledell Peoples, MD Department of Hematology/Oncology [place] [facility] Phone: [phone] Pager: [phone] Email: [email or web address]  06/04/2020 4:29 PM

## 2020-06-05 ENCOUNTER — Encounter: Payer: Self-pay | Admitting: Hematology and Oncology

## 2020-06-05 ENCOUNTER — Telehealth: Payer: Self-pay | Admitting: *Deleted

## 2020-06-05 NOTE — Telephone Encounter (Signed)
TCT patient regarding lab results. Spoke with patient and advised that she remains low in iron, which can result in elevated platelet.  Advised that Dr. Lorenso Courier recommends IV ron x 2 , 1 week apart. Advised  That schedulers have been notified of the need for these appts and also,, that her Iron infusions need to be approved through her insurance. Advised that it may take a week or so for all of that to take place. Pt voiced understanding. Advised to continue her oral iron in the meantime. Pt is agreeable to this.

## 2020-06-05 NOTE — Telephone Encounter (Signed)
-----   Message from Orson Slick, MD sent at 06/05/2020 10:33 AM EST ----- Please let Sabrina Crane know that her iron levels are still quite low and are the likely reason her platelets have not gone back to normal. The oral iron does not appear to be enough to increase her levels. I have requested for her to have IV iron infusions scheduled to help boost her levels.   ----- Message ----- From: Buel Ream, Lab [facility] [place] Sent: 06/04/2020  11:04 AM EST To: Orson Slick, MD

## 2020-06-06 LAB — HM MAMMOGRAPHY

## 2020-06-07 ENCOUNTER — Encounter: Payer: Self-pay | Admitting: Hematology and Oncology

## 2020-06-08 ENCOUNTER — Encounter: Payer: Self-pay | Admitting: Family Medicine

## 2020-06-22 ENCOUNTER — Telehealth: Payer: Self-pay

## 2020-06-22 NOTE — Telephone Encounter (Signed)
Pt called to inquire about fereheme appointment. Message sent to scheduling. PA still in process.

## 2020-06-23 ENCOUNTER — Telehealth: Payer: Self-pay | Admitting: Hematology and Oncology

## 2020-06-23 NOTE — Telephone Encounter (Signed)
Scheduled appt per 2/7 sch msg - unable to reach pt. Left message for patient with appt date and time

## 2020-06-24 ENCOUNTER — Telehealth: Payer: Self-pay | Admitting: *Deleted

## 2020-06-24 ENCOUNTER — Other Ambulatory Visit: Payer: Self-pay | Admitting: Hematology and Oncology

## 2020-06-24 NOTE — Telephone Encounter (Signed)
TCT patient regarding upcoming appts for IV iron. No answer but was able to leave vm message for pt advising that her insurance would only approve the 5 visit form of iv iron-Venofer, not Fereheme. Advised that 3 additional appts have been added on to her schedule. Advised to check her My Chart. Advised that her first appt is 06/26/20

## 2020-06-26 ENCOUNTER — Inpatient Hospital Stay: Payer: BC Managed Care – PPO | Attending: Hematology and Oncology

## 2020-06-26 ENCOUNTER — Other Ambulatory Visit: Payer: Self-pay

## 2020-06-26 VITALS — BP 122/75 | HR 75 | Temp 98.1°F | Resp 16

## 2020-06-26 DIAGNOSIS — D509 Iron deficiency anemia, unspecified: Secondary | ICD-10-CM | POA: Insufficient documentation

## 2020-06-26 DIAGNOSIS — D500 Iron deficiency anemia secondary to blood loss (chronic): Secondary | ICD-10-CM

## 2020-06-26 MED ORDER — SODIUM CHLORIDE 0.9 % IV SOLN
200.0000 mg | Freq: Once | INTRAVENOUS | Status: AC
  Administered 2020-06-26: 200 mg via INTRAVENOUS
  Filled 2020-06-26: qty 200

## 2020-06-26 MED ORDER — SODIUM CHLORIDE 0.9 % IV SOLN
Freq: Once | INTRAVENOUS | Status: AC
  Filled 2020-06-26: qty 250

## 2020-06-26 NOTE — Patient Instructions (Signed)

## 2020-07-03 ENCOUNTER — Other Ambulatory Visit: Payer: Self-pay

## 2020-07-03 ENCOUNTER — Inpatient Hospital Stay: Payer: BC Managed Care – PPO

## 2020-07-03 ENCOUNTER — Encounter: Payer: Self-pay | Admitting: Hematology and Oncology

## 2020-07-03 VITALS — BP 115/75 | HR 74 | Temp 98.3°F | Resp 18

## 2020-07-03 DIAGNOSIS — D509 Iron deficiency anemia, unspecified: Secondary | ICD-10-CM | POA: Diagnosis not present

## 2020-07-03 DIAGNOSIS — D500 Iron deficiency anemia secondary to blood loss (chronic): Secondary | ICD-10-CM

## 2020-07-03 MED ORDER — SODIUM CHLORIDE 0.9 % IV SOLN
Freq: Once | INTRAVENOUS | Status: AC
  Filled 2020-07-03: qty 250

## 2020-07-03 MED ORDER — SODIUM CHLORIDE 0.9 % IV SOLN
200.0000 mg | Freq: Once | INTRAVENOUS | Status: AC
  Administered 2020-07-03: 200 mg via INTRAVENOUS
  Filled 2020-07-03: qty 200

## 2020-07-03 NOTE — Patient Instructions (Signed)

## 2020-07-07 ENCOUNTER — Encounter: Payer: Self-pay | Admitting: Medical

## 2020-07-07 DIAGNOSIS — D692 Other nonthrombocytopenic purpura: Secondary | ICD-10-CM | POA: Insufficient documentation

## 2020-07-10 ENCOUNTER — Other Ambulatory Visit: Payer: Self-pay

## 2020-07-10 ENCOUNTER — Inpatient Hospital Stay: Payer: BC Managed Care – PPO

## 2020-07-10 VITALS — BP 115/62 | HR 76 | Temp 98.0°F | Resp 16

## 2020-07-10 DIAGNOSIS — D500 Iron deficiency anemia secondary to blood loss (chronic): Secondary | ICD-10-CM

## 2020-07-10 DIAGNOSIS — D509 Iron deficiency anemia, unspecified: Secondary | ICD-10-CM | POA: Diagnosis not present

## 2020-07-10 MED ORDER — SODIUM CHLORIDE 0.9 % IV SOLN
200.0000 mg | Freq: Once | INTRAVENOUS | Status: AC
  Administered 2020-07-10: 200 mg via INTRAVENOUS
  Filled 2020-07-10: qty 200

## 2020-07-10 MED ORDER — SODIUM CHLORIDE 0.9 % IV SOLN
Freq: Once | INTRAVENOUS | Status: AC
Start: 2020-07-10 — End: 2020-07-10
  Filled 2020-07-10: qty 250

## 2020-07-10 NOTE — Patient Instructions (Signed)

## 2020-07-14 ENCOUNTER — Telehealth: Payer: Self-pay | Admitting: Hematology and Oncology

## 2020-07-14 NOTE — Telephone Encounter (Signed)
Scheduled per 2/28 sch msg. Called and spoke with pt, confirmed 4/18 appts

## 2020-07-16 ENCOUNTER — Encounter: Payer: Self-pay | Admitting: Family Medicine

## 2020-07-16 ENCOUNTER — Other Ambulatory Visit: Payer: Self-pay

## 2020-07-16 ENCOUNTER — Ambulatory Visit: Payer: BC Managed Care – PPO | Admitting: Family Medicine

## 2020-07-16 VITALS — BP 124/82 | HR 81 | Temp 97.8°F | Wt 167.6 lb

## 2020-07-16 DIAGNOSIS — I10 Essential (primary) hypertension: Secondary | ICD-10-CM | POA: Diagnosis not present

## 2020-07-16 DIAGNOSIS — D509 Iron deficiency anemia, unspecified: Secondary | ICD-10-CM

## 2020-07-16 DIAGNOSIS — R0789 Other chest pain: Secondary | ICD-10-CM | POA: Diagnosis not present

## 2020-07-16 NOTE — Patient Instructions (Signed)
Let me know if you have any new or worsening symptoms.   Try to minimize upper body exertion until your pain improves.     Chest Wall Pain Chest wall pain is pain in or around the bones and muscles of your chest. Chest wall pain may be caused by:  An injury.  Coughing a lot.  Using your chest and arm muscles too much. Sometimes, the cause may not be known. This pain may take a few weeks or longer to get better. Follow these instructions at home: Managing pain, stiffness, and swelling If told, put ice on the painful area:  Put ice in a plastic bag.  Place a towel between your skin and the bag.  Leave the ice on for 20 minutes, 2-3 times a day.   Activity  Rest as told by your doctor.  Avoid doing things that cause pain. This includes lifting heavy items.  Ask your doctor what activities are safe for you. General instructions  Take over-the-counter and prescription medicines only as told by your doctor.  Do not use any products that contain nicotine or tobacco, such as cigarettes, e-cigarettes, and chewing tobacco. If you need help quitting, ask your doctor.  Keep all follow-up visits as told by your doctor. This is important.   Contact a doctor if:  You have a fever.  Your chest pain gets worse.  You have new symptoms. Get help right away if:  You feel sick to your stomach (nauseous) or you throw up (vomit).  You feel sweaty or light-headed.  You have a cough with mucus from your lungs (sputum) or you cough up blood.  You are short of breath. These symptoms may be an emergency. Do not wait to see if the symptoms will go away. Get medical help right away. Call your local emergency services (911 in the [place] Do not drive yourself to the hospital. Summary  Chest wall pain is pain in or around the bones and muscles of your chest.  It may be treated with ice, rest, and medicines. Your condition may also get better if you avoid doing things that cause  pain.  Contact a doctor if you have a fever, chest pain that gets worse, or new symptoms.  Get help right away if you feel light-headed or you get short of breath. These symptoms may be an emergency. This information is not intended to replace advice given to you by your health care provider. Make sure you discuss any questions you have with your health care provider. Document Revised: 11/02/2017 Document Reviewed: 11/02/2017 Elsevier Patient Education  2021 [employer].

## 2020-07-16 NOTE — Progress Notes (Signed)
   Subjective:    Patient ID: Sabrina Crane, female    DOB: 09/12/1970, 50 y.o.   MRN: [id number]  HPI Chief Complaint  Patient presents with  . other    Chest pain upper chest throat area started this morning could be something she ate last night.    Complains of left upper chest pain that started this morning while she was laying in bed. She was awakened by her daughter calling and then she noticed the pain.  States the pain is non radiating and dull. 5/10 currently  States the pain is worse with certain movements. Pain improves with rest.   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  She has not taken anything for her symptoms.  Denies fever, chills, dizziness, headache, palpitations, cough, shortness of breath, abdominal pain, N/V/D, urinary symptoms, LE edema.   Denies leg pain or calf swelling. No history of DVT.   Reviewed allergies, medications, past medical, surgical, family, and social history.   Review of Systems Pertinent positives and negatives in the history of present illness.     Objective:   Physical Exam BP 124/82   Pulse 81   Temp 97.8 F (36.6 C)   Wt 167 lb 9.6 oz (76 kg)   LMP 06/14/2020   BMI 28.77 kg/m   Alert and in no distress. Left upper chest wall TTP.  Cardiac exam shows a regular sinus rhythm without murmurs or gallops. Lungs are clear to auscultation. Extremities without edema. Skin is warm and dry.       Assessment & Plan:  Left-sided chest wall pain - Plan: EKG 75-IEPP, Basic metabolic panel, CBC with Differential/Platelet  Chest wall tenderness  Essential hypertension - Plan: CBC with Differential/Platelet  Iron deficiency anemia, unspecified iron deficiency anemia type - Plan: CBC with Differential/Platelet  EKG shows NSR, rate 76, L axis deviation. Read by Dr. Redmond School and myself.  BP controlled.  Receiving iron transfusions for IDA Discussed risk factors for heart disease including HTN and  family history.  Denies all other symptoms except for left chest wall pain and chest wall TTP. No pleuritic pain.  Unlikely to be any other etiology other than MSK. She will let me know if she has any new or worsening symptoms.

## 2020-07-17 ENCOUNTER — Inpatient Hospital Stay: Payer: BC Managed Care – PPO | Attending: Hematology and Oncology

## 2020-07-17 ENCOUNTER — Encounter: Payer: Self-pay | Admitting: Family Medicine

## 2020-07-17 ENCOUNTER — Other Ambulatory Visit: Payer: Self-pay

## 2020-07-17 VITALS — BP 112/65 | HR 87 | Temp 98.0°F | Resp 18

## 2020-07-17 DIAGNOSIS — D509 Iron deficiency anemia, unspecified: Secondary | ICD-10-CM | POA: Insufficient documentation

## 2020-07-17 DIAGNOSIS — D500 Iron deficiency anemia secondary to blood loss (chronic): Secondary | ICD-10-CM

## 2020-07-17 DIAGNOSIS — I10 Essential (primary) hypertension: Secondary | ICD-10-CM

## 2020-07-17 LAB — CBC WITH DIFFERENTIAL/PLATELET
Basophils Absolute: 0.1 10*3/uL (ref 0.0–0.2)
Basos: 1 %
EOS (ABSOLUTE): 0.1 10*3/uL (ref 0.0–0.4)
Eos: 2 %
Hematocrit: 38.6 % (ref 34.0–46.6)
Hemoglobin: 12 g/dL (ref 11.1–15.9)
Immature Grans (Abs): 0 10*3/uL (ref 0.0–0.1)
Immature Granulocytes: 0 %
Lymphocytes Absolute: 2.5 10*3/uL (ref 0.7–3.1)
Lymphs: 38 %
MCH: 24.3 pg — ABNORMAL LOW (ref 26.6–33.0)
MCHC: 31.1 g/dL — ABNORMAL LOW (ref 31.5–35.7)
MCV: 78 fL — ABNORMAL LOW (ref 79–97)
Monocytes Absolute: 0.5 10*3/uL (ref 0.1–0.9)
Monocytes: 8 %
Neutrophils Absolute: 3.3 10*3/uL (ref 1.4–7.0)
Neutrophils: 51 %
Platelets: 423 10*3/uL (ref 150–450)
RBC: 4.94 x10E6/uL (ref 3.77–5.28)
RDW: 15.3 % (ref 11.7–15.4)
WBC: 6.4 10*3/uL (ref 3.4–10.8)

## 2020-07-17 LAB — BASIC METABOLIC PANEL
BUN/Creatinine Ratio: 15 (ref 9–23)
BUN: 12 mg/dL (ref 6–24)
CO2: 24 mmol/L (ref 20–29)
Calcium: 9.9 mg/dL (ref 8.7–10.2)
Chloride: 99 mmol/L (ref 96–106)
Creatinine, Ser: 0.81 mg/dL (ref 0.57–1.00)
Glucose: 77 mg/dL (ref 65–99)
Potassium: 3.9 mmol/L (ref 3.5–5.2)
Sodium: 139 mmol/L (ref 134–144)
eGFR: 88 mL/min/{1.73_m2} (ref >59)

## 2020-07-17 MED ORDER — SODIUM CHLORIDE 0.9 % IV SOLN
Freq: Once | INTRAVENOUS | Status: AC
  Filled 2020-07-17: qty 250

## 2020-07-17 MED ORDER — SODIUM CHLORIDE 0.9 % IV SOLN
200.0000 mg | Freq: Once | INTRAVENOUS | Status: AC
  Administered 2020-07-17: 200 mg via INTRAVENOUS
  Filled 2020-07-17: qty 200

## 2020-07-17 NOTE — Patient Instructions (Signed)

## 2020-07-24 ENCOUNTER — Other Ambulatory Visit: Payer: Self-pay

## 2020-07-24 ENCOUNTER — Inpatient Hospital Stay: Payer: BC Managed Care – PPO

## 2020-07-24 VITALS — BP 114/63 | HR 80 | Temp 98.0°F | Resp 18 | Wt 167.8 lb

## 2020-07-24 DIAGNOSIS — D509 Iron deficiency anemia, unspecified: Secondary | ICD-10-CM | POA: Diagnosis not present

## 2020-07-24 DIAGNOSIS — D500 Iron deficiency anemia secondary to blood loss (chronic): Secondary | ICD-10-CM

## 2020-07-24 MED ORDER — SODIUM CHLORIDE 0.9 % IV SOLN
Freq: Once | INTRAVENOUS | Status: AC
  Filled 2020-07-24: qty 250

## 2020-07-24 MED ORDER — SODIUM CHLORIDE 0.9 % IV SOLN
200.0000 mg | Freq: Once | INTRAVENOUS | Status: AC
  Administered 2020-07-24: 200 mg via INTRAVENOUS
  Filled 2020-07-24: qty 200

## 2020-07-24 NOTE — Patient Instructions (Signed)

## 2020-08-11 ENCOUNTER — Other Ambulatory Visit: Payer: Self-pay | Admitting: Family Medicine

## 2020-08-11 DIAGNOSIS — I10 Essential (primary) hypertension: Secondary | ICD-10-CM

## 2020-08-12 MED ORDER — LOSARTAN POTASSIUM-HCTZ 50-12.5 MG PO TABS: 1.0000 | ORAL_TABLET | Freq: Every day | ORAL | 0 refills | Status: DC

## 2020-08-12 NOTE — Telephone Encounter (Signed)
Just refilled this today

## 2020-08-27 ENCOUNTER — Encounter: Payer: Self-pay | Admitting: Internal Medicine

## 2020-08-31 ENCOUNTER — Inpatient Hospital Stay: Payer: BC Managed Care – PPO

## 2020-08-31 ENCOUNTER — Other Ambulatory Visit: Payer: Self-pay | Admitting: Hematology and Oncology

## 2020-08-31 ENCOUNTER — Telehealth: Payer: Self-pay | Admitting: Hematology and Oncology

## 2020-08-31 ENCOUNTER — Encounter: Payer: Self-pay | Admitting: Internal Medicine

## 2020-08-31 ENCOUNTER — Other Ambulatory Visit: Payer: Self-pay

## 2020-08-31 ENCOUNTER — Encounter: Payer: Self-pay | Admitting: Hematology and Oncology

## 2020-08-31 ENCOUNTER — Inpatient Hospital Stay: Payer: BC Managed Care – PPO | Attending: Hematology and Oncology | Admitting: Hematology and Oncology

## 2020-08-31 VITALS — BP 123/80 | HR 77 | Temp 98.1°F | Resp 17 | Ht 64.0 in | Wt 171.1 lb

## 2020-08-31 DIAGNOSIS — N920 Excessive and frequent menstruation with regular cycle: Secondary | ICD-10-CM | POA: Diagnosis present

## 2020-08-31 DIAGNOSIS — D500 Iron deficiency anemia secondary to blood loss (chronic): Secondary | ICD-10-CM

## 2020-08-31 DIAGNOSIS — I10 Essential (primary) hypertension: Secondary | ICD-10-CM | POA: Diagnosis not present

## 2020-08-31 DIAGNOSIS — Z79899 Other long term (current) drug therapy: Secondary | ICD-10-CM | POA: Diagnosis not present

## 2020-08-31 DIAGNOSIS — D75839 Thrombocytosis, unspecified: Secondary | ICD-10-CM

## 2020-08-31 LAB — CBC WITH DIFFERENTIAL (CANCER CENTER ONLY)
Abs Immature Granulocytes: 0.01 10*3/uL (ref 0.00–0.07)
Basophils Absolute: 0 10*3/uL (ref 0.0–0.1)
Basophils Relative: 1 %
Eosinophils Absolute: 0.1 10*3/uL (ref 0.0–0.5)
Eosinophils Relative: 2 %
HCT: 38 % (ref 36.0–46.0)
Hemoglobin: 12 g/dL (ref 12.0–15.0)
Immature Granulocytes: 0 %
Lymphocytes Relative: 34 %
Lymphs Abs: 2.2 10*3/uL (ref 0.7–4.0)
MCH: 24.4 pg — ABNORMAL LOW (ref 26.0–34.0)
MCHC: 31.6 g/dL (ref 30.0–36.0)
MCV: 77.4 fL — ABNORMAL LOW (ref 80.0–100.0)
Monocytes Absolute: 0.3 10*3/uL (ref 0.1–1.0)
Monocytes Relative: 5 %
Neutro Abs: 3.9 10*3/uL (ref 1.7–7.7)
Neutrophils Relative %: 58 %
Platelet Count: 401 10*3/uL — ABNORMAL HIGH (ref 150–400)
RBC: 4.91 MIL/uL (ref 3.87–5.11)
RDW: 13.7 % (ref 11.5–15.5)
WBC Count: 6.6 10*3/uL (ref 4.0–10.5)
nRBC: 0 % (ref 0.0–0.2)

## 2020-08-31 LAB — RETIC PANEL
Immature Retic Fract: 13.4 % (ref 2.3–15.9)
RBC.: 4.85 MIL/uL (ref 3.87–5.11)
Retic Count, Absolute: 89.7 10*3/uL (ref 19.0–186.0)
Retic Ct Pct: 1.9 % (ref 0.4–3.1)
Reticulocyte Hemoglobin: 27.6 pg — ABNORMAL LOW (ref >27.9)

## 2020-08-31 LAB — CMP (CANCER CENTER ONLY)
ALT: 13 U/L (ref 0–44)
AST: 13 U/L — ABNORMAL LOW (ref 15–41)
Albumin: 4.1 g/dL (ref 3.5–5.0)
Alkaline Phosphatase: 50 U/L (ref 38–126)
Anion gap: 15 (ref 5–15)
BUN: 12 mg/dL (ref 6–20)
CO2: 24 mmol/L (ref 22–32)
Calcium: 9.1 mg/dL (ref 8.9–10.3)
Chloride: 101 mmol/L (ref 98–111)
Creatinine: 0.84 mg/dL (ref 0.44–1.00)
GFR, Estimated: >60 mL/min (ref >60)
Glucose, Bld: 120 mg/dL — ABNORMAL HIGH (ref 70–99)
Potassium: 3.2 mmol/L — ABNORMAL LOW (ref 3.5–5.1)
Sodium: 140 mmol/L (ref 135–145)
Total Bilirubin: 0.4 mg/dL (ref 0.3–1.2)
Total Protein: 7.4 g/dL (ref 6.5–8.1)

## 2020-08-31 LAB — IRON AND TIBC
Iron: 75 ug/dL (ref 41–142)
Saturation Ratios: 23 % (ref 21–57)
TIBC: 321 ug/dL (ref 236–444)
UIBC: 246 ug/dL (ref 120–384)

## 2020-08-31 LAB — FERRITIN: Ferritin: 186 ng/mL (ref 11–307)

## 2020-08-31 MED ORDER — FERROUS SULFATE 325 (65 FE) MG PO TABS: 325.0000 mg | ORAL_TABLET | Freq: Every day | ORAL | 1 refills | Status: AC

## 2020-08-31 MED ORDER — ALTEPLASE 2 MG IJ SOLR
INTRAMUSCULAR | Status: AC
  Filled 2020-08-31: qty 2

## 2020-08-31 NOTE — Progress Notes (Signed)
[place] [phone]   [phone]  PROGRESS NOTE  Patient Care Team: Girtha Rm, NP-C as PCP - General (Family Medicine)  Hematological/Oncological History # Thrombocytosis # Iron Deficiency Anemia 2/2 to GYN Bleeding 07/18/2019: WBC 5.6, Hgb 10.5, MCV 76, Plt 462 03/25/2020: WBC 7.3, Hgb 10.5, MCV 77, Plt 453 06/05/2019: establish care with Dr. Lorenso Courier  06/26/2020-07/24/2020: IV iron sucrose 200mg  weekly x 5 doses 08/31/2020: Hgb 12.0, MCV 77.4, Plt 401  Interval History:  Sabrina Crane 50 y.o. female with medical history significant for iron deficiency anemia/thrombocytosis presents for a follow up visit. The patient's last visit was on 06/04/2020 at which time she established care. In the interim since the last visit she received 5 doses of IV iron sucrose.  On exam today Sabrina Crane reports that she feels "okay".  She notes that when she first she is feeling much more tired and that now she feels better these days.  She notes that she had no trouble with the IV iron infusions with no side effects.  She also reports that in the interim since those infusions she has not been taking any iron pills or eating any iron rich foods in order to try to boost her levels.  She reports her menstrual cycles remain quite heavy and that her last 1 was "the worst".  It lasted for about 7 days and at one point time was changing pads every 15 minutes.  She was started on hormone pills by her OB/GYN and recent underwent a CT scan which showed a cyst on her ovaries which is going to be evaluated next month.  She is had no other overt bleeding, bruising, or dark stools.  She currently denies any fevers, chills, sweats, nausea, vomiting or diarrhea.  A full 10 point ROS is listed below.  MEDICAL HISTORY:  Past Medical History:  Diagnosis Date  . Anemia   . Genital herpes   . Hypertension   . IDA (iron deficiency anemia) 11/12/2019  . Thrombocytosis 11/20/2019     SURGICAL HISTORY: Past Surgical History:  Procedure Laterality Date  . CERVICAL BIOPSY  W/ LOOP ELECTRODE EXCISION    . CHOLECYSTECTOMY    . TONSILLECTOMY AND ADENOIDECTOMY    . TUBAL LIGATION      SOCIAL HISTORY: Social History   Socioeconomic History  . Marital status: Married    Spouse name: Not on file  . Number of children: 4  . Years of education: Not on file  . Highest education level: Not on file  Occupational History  . Occupation: Scientist, water quality  Tobacco Use  . Smoking status: Never Smoker  . Smokeless tobacco: Never Used  Vaping Use  . Vaping Use: Never used  Substance and Sexual Activity  . Alcohol use: Never  . Drug use: Never  . Sexual activity: Yes    Birth control/protection: Surgical  Other Topics Concern  . Not on file  Social History Narrative  . Not on file   Social Determinants of Health   Financial Resource Strain: Not on file  Food Insecurity: Not on file  Transportation Needs: Not on file  Physical Activity: Not on file  Stress: Not on file  Social Connections: Not on file  Intimate Partner Violence: Not on file    FAMILY HISTORY: Family History  Problem Relation Age of Onset  . Cancer Sister        cholangiocarcinoma   . Diabetes Mother   . Hypertension Mother   . Diabetes Father   .  Kidney disease Half-Brother   . Diabetes Half-Brother   . Liver disease Half-Brother   . HIV Half-Brother   . Colon cancer Maternal Uncle   . Colon polyps Neg Hx   . Esophageal cancer Neg Hx   . Rectal cancer Neg Hx   . Stomach cancer Neg Hx     ALLERGIES:  is allergic to latex.  MEDICATIONS:  Current Outpatient Medications  Medication Sig Dispense Refill  . ferrous sulfate 325 (65 FE) MG tablet Take 1 tablet (325 mg total) by mouth daily with breakfast. Please take with a source of Vitamin C 90 tablet 1  . ASPIRIN PO Take by mouth.    . estradiol-norethindrone (ACTIVELLA) 1-0.5 MG tablet Take 1 tablet by mouth daily.    Marland Kitchen  losartan-hydrochlorothiazide (HYZAAR) 50-12.5 MG tablet Take 1 tablet by mouth daily. 90 tablet 0  . valACYclovir (VALTREX) 1000 MG tablet Take 2 tablets (2,000 mg total) by mouth 2 (two) times daily. X 1 day at onset of outbreak 30 tablet 2   No current facility-administered medications for this visit.    REVIEW OF SYSTEMS:   Constitutional: ( - ) fevers, ( - )  chills , ( - ) night sweats Eyes: ( - ) blurriness of vision, ( - ) double vision, ( - ) watery eyes Ears, nose, mouth, throat, and face: ( - ) mucositis, ( - ) sore throat Respiratory: ( - ) cough, ( - ) dyspnea, ( - ) wheezes Cardiovascular: ( - ) palpitation, ( - ) chest discomfort, ( - ) lower extremity swelling Gastrointestinal:  ( - ) nausea, ( - ) heartburn, ( - ) change in bowel habits Skin: ( - ) abnormal skin rashes Lymphatics: ( - ) new lymphadenopathy, ( - ) easy bruising Neurological: ( - ) numbness, ( - ) tingling, ( - ) new weaknesses Behavioral/Psych: ( - ) mood change, ( - ) new changes  All other systems were reviewed with the patient and are negative.  PHYSICAL EXAMINATION: ECOG PERFORMANCE STATUS: 1 - Symptomatic but completely ambulatory  Vitals:   08/31/20 0816  BP: 123/80  Pulse: 77  Resp: 17  Temp: 98.1 F (36.7 C)  SpO2: 99%   Filed Weights   08/31/20 0816  Weight: 171 lb 1.6 oz (77.6 kg)    GENERAL: well appearing middle aged [place] American female. alert, no distress and comfortable SKIN: skin color, texture, turgor are normal, no rashes or significant lesions EYES: conjunctiva are pink and non-injected, sclera clear LUNGS: clear to auscultation and percussion with normal breathing effort HEART: regular rate & rhythm and no murmurs and no lower extremity edema Musculoskeletal: no cyanosis of digits and no clubbing  PSYCH: alert & oriented x 3, fluent speech NEURO: no focal motor/sensory deficits  LABORATORY DATA:  I have reviewed the data as listed CBC Latest Ref Rng & Units  08/31/2020 07/16/2020 06/04/2020  WBC 4.0 - 10.5 K/uL 6.6 6.4 6.1  Hemoglobin 12.0 - 15.0 g/dL 12.0 12.0 11.3(L)  Hematocrit 36.0 - 46.0 % 38.0 38.6 35.7(L)  Platelets 150 - 400 K/uL 401(H) 423 404(H)    CMP Latest Ref Rng & Units 08/31/2020 07/16/2020 06/04/2020  Glucose 70 - 99 mg/dL 120(H) 77 85  BUN 6 - 20 mg/dL 12 12 9   Creatinine 0.44 - 1.00 mg/dL 0.84 0.81 0.76  Sodium 135 - 145 mmol/L 140 139 137  Potassium 3.5 - 5.1 mmol/L 3.2(L) 3.9 3.7  Chloride 98 - 111 mmol/L 101 99 101  CO2  22 - 32 mmol/L 24 24 28   Calcium 8.9 - 10.3 mg/dL 9.1 9.9 9.3  Total Protein 6.5 - 8.1 g/dL 7.4 - 7.7  Total Bilirubin 0.3 - 1.2 mg/dL 0.4 - 0.3  Alkaline Phos 38 - 126 U/L 50 - 54  AST 15 - 41 U/L 13(L) - 12(L)  ALT 0 - 44 U/L 13 - 17    RADIOGRAPHIC STUDIES: No results found.  ASSESSMENT & PLAN Sabrina Crane 50 y.o. female with medical history significant for iron deficiency anemia/thrombocytosis presents for a follow up visit.  After review the labs, review the records, and discussion with the patient the findings are most consistent with a thrombocytosis secondary to iron deficiency anemia.  The patient has heavy menstrual cycles and it is likely that she has not repleting her iron adequately with p.o. therapy.  She was administered 5 doses of IV iron sucrose from 06/26/2020 up until 07/24/2020.  Her hemoglobin improved to low normal range of 12.0.  The patient still requires adequate control of her heavy menstrual cycles which is the primary source of her blood loss.  She is following with OB/GYN and is currently taking hormone pills with little improvement.  # Thrombocytosis # Iron Deficiency Anemia 2/2 to GYN Bleeding --patient is s/p IV iron sucrose x 5 doses from 06/26/2020-07/24/2020 --Hgb increased to low normal levels of 12.0. MCV is still low with low reticulocyte hemoglobin --encourage continued follow up with OB/GYN for better control of her menstrual cycles --recommend PO iron sulfate 325mg   daily with a source of a vitamin C --return for labs in 6 weeks with clinic visit in 12 weeks.    No orders of the defined types were placed in this encounter.  All questions were answered. The patient knows to call the clinic with any problems, questions or concerns.  A total of more than 30 minutes were spent on this encounter and over half of that time was spent on counseling and coordination of care as outlined above.   Ledell Peoples, MD Department of Hematology/Oncology [place] at [facility] Phone: [phone] Pager: [phone] Email: [email or web address]  08/31/2020 8:43 AM

## 2020-08-31 NOTE — Telephone Encounter (Signed)
Scheduled follow-up appointment per 4/18 los. Patient is aware. ?

## 2020-09-03 ENCOUNTER — Encounter: Payer: Self-pay | Admitting: Internal Medicine

## 2020-10-09 ENCOUNTER — Telehealth: Payer: Self-pay | Admitting: Hematology and Oncology

## 2020-10-09 NOTE — Telephone Encounter (Signed)
Rescheduled appointment per 05/27 sch msg. Patient is aware.

## 2020-10-13 ENCOUNTER — Inpatient Hospital Stay: Payer: BC Managed Care – PPO

## 2020-10-13 ENCOUNTER — Other Ambulatory Visit: Payer: Self-pay | Admitting: Hematology and Oncology

## 2020-10-13 DIAGNOSIS — D500 Iron deficiency anemia secondary to blood loss (chronic): Secondary | ICD-10-CM

## 2020-10-14 ENCOUNTER — Inpatient Hospital Stay: Payer: BC Managed Care – PPO | Attending: Hematology and Oncology

## 2020-10-14 ENCOUNTER — Encounter: Payer: Self-pay | Admitting: Hematology and Oncology

## 2020-10-14 ENCOUNTER — Other Ambulatory Visit: Payer: Self-pay

## 2020-10-14 DIAGNOSIS — D75839 Thrombocytosis, unspecified: Secondary | ICD-10-CM | POA: Diagnosis present

## 2020-10-14 DIAGNOSIS — D500 Iron deficiency anemia secondary to blood loss (chronic): Secondary | ICD-10-CM | POA: Insufficient documentation

## 2020-10-14 DIAGNOSIS — N920 Excessive and frequent menstruation with regular cycle: Secondary | ICD-10-CM | POA: Diagnosis present

## 2020-10-14 LAB — CBC WITH DIFFERENTIAL (CANCER CENTER ONLY)
Abs Immature Granulocytes: 0 10*3/uL (ref 0.00–0.07)
Basophils Absolute: 0 10*3/uL (ref 0.0–0.1)
Basophils Relative: 1 %
Eosinophils Absolute: 0.2 10*3/uL (ref 0.0–0.5)
Eosinophils Relative: 3 %
HCT: 34.4 % — ABNORMAL LOW (ref 36.0–46.0)
Hemoglobin: 11.1 g/dL — ABNORMAL LOW (ref 12.0–15.0)
Immature Granulocytes: 0 %
Lymphocytes Relative: 38 %
Lymphs Abs: 2.2 10*3/uL (ref 0.7–4.0)
MCH: 24.7 pg — ABNORMAL LOW (ref 26.0–34.0)
MCHC: 32.3 g/dL (ref 30.0–36.0)
MCV: 76.4 fL — ABNORMAL LOW (ref 80.0–100.0)
Monocytes Absolute: 0.3 10*3/uL (ref 0.1–1.0)
Monocytes Relative: 5 %
Neutro Abs: 3 10*3/uL (ref 1.7–7.7)
Neutrophils Relative %: 53 %
Platelet Count: 369 10*3/uL (ref 150–400)
RBC: 4.5 MIL/uL (ref 3.87–5.11)
RDW: 13.4 % (ref 11.5–15.5)
WBC Count: 5.6 10*3/uL (ref 4.0–10.5)
nRBC: 0 % (ref 0.0–0.2)

## 2020-10-14 LAB — CMP (CANCER CENTER ONLY)
ALT: 12 U/L (ref 0–44)
AST: 9 U/L — ABNORMAL LOW (ref 15–41)
Albumin: 3.7 g/dL (ref 3.5–5.0)
Alkaline Phosphatase: 47 U/L (ref 38–126)
Anion gap: 12 (ref 5–15)
BUN: 12 mg/dL (ref 6–20)
CO2: 24 mmol/L (ref 22–32)
Calcium: 8.8 mg/dL — ABNORMAL LOW (ref 8.9–10.3)
Chloride: 106 mmol/L (ref 98–111)
Creatinine: 0.77 mg/dL (ref 0.44–1.00)
GFR, Estimated: >60 mL/min (ref >60)
Glucose, Bld: 116 mg/dL — ABNORMAL HIGH (ref 70–99)
Potassium: 3.2 mmol/L — ABNORMAL LOW (ref 3.5–5.1)
Sodium: 142 mmol/L (ref 135–145)
Total Bilirubin: 0.3 mg/dL (ref 0.3–1.2)
Total Protein: 6.7 g/dL (ref 6.5–8.1)

## 2020-10-14 LAB — RETIC PANEL
Immature Retic Fract: 18.9 % — ABNORMAL HIGH (ref 2.3–15.9)
RBC.: 4.53 MIL/uL (ref 3.87–5.11)
Retic Count, Absolute: 94.2 10*3/uL (ref 19.0–186.0)
Retic Ct Pct: 2.1 % (ref 0.4–3.1)
Reticulocyte Hemoglobin: 27.6 pg — ABNORMAL LOW (ref >27.9)

## 2020-10-14 LAB — IRON AND TIBC
Iron: 81 ug/dL (ref 41–142)
Saturation Ratios: 28 % (ref 21–57)
TIBC: 284 ug/dL (ref 236–444)
UIBC: 203 ug/dL (ref 120–384)

## 2020-10-14 LAB — FERRITIN: Ferritin: 170 ng/mL (ref 11–307)

## 2020-11-12 ENCOUNTER — Other Ambulatory Visit: Payer: Self-pay | Admitting: Family Medicine

## 2020-11-12 DIAGNOSIS — I10 Essential (primary) hypertension: Secondary | ICD-10-CM

## 2020-11-13 MED ORDER — LOSARTAN POTASSIUM-HCTZ 50-12.5 MG PO TABS: 1.0000 | ORAL_TABLET | Freq: Every day | ORAL | 0 refills | Status: DC

## 2020-11-19 ENCOUNTER — Telehealth: Payer: Self-pay | Admitting: Hematology and Oncology

## 2020-11-19 NOTE — Telephone Encounter (Signed)
R/s appts per 7/7 sch msg. Pt aware.  

## 2020-11-23 ENCOUNTER — Inpatient Hospital Stay: Payer: BC Managed Care – PPO

## 2020-11-23 ENCOUNTER — Inpatient Hospital Stay: Payer: BC Managed Care – PPO | Admitting: Hematology and Oncology

## 2020-12-07 ENCOUNTER — Other Ambulatory Visit: Payer: Self-pay | Admitting: Hematology and Oncology

## 2020-12-07 ENCOUNTER — Inpatient Hospital Stay: Payer: BC Managed Care – PPO | Attending: Hematology and Oncology | Admitting: Hematology and Oncology

## 2020-12-07 ENCOUNTER — Inpatient Hospital Stay: Payer: BC Managed Care – PPO

## 2020-12-07 ENCOUNTER — Encounter: Payer: Self-pay | Admitting: Hematology and Oncology

## 2020-12-07 ENCOUNTER — Ambulatory Visit: Payer: BC Managed Care – PPO | Admitting: Family Medicine

## 2020-12-07 ENCOUNTER — Other Ambulatory Visit: Payer: Self-pay

## 2020-12-07 VITALS — BP 111/74 | HR 78 | Temp 97.1°F | Resp 18 | Wt 163.4 lb

## 2020-12-07 DIAGNOSIS — D500 Iron deficiency anemia secondary to blood loss (chronic): Secondary | ICD-10-CM | POA: Diagnosis present

## 2020-12-07 DIAGNOSIS — R718 Other abnormality of red blood cells: Secondary | ICD-10-CM | POA: Diagnosis not present

## 2020-12-07 DIAGNOSIS — Z79899 Other long term (current) drug therapy: Secondary | ICD-10-CM | POA: Diagnosis not present

## 2020-12-07 DIAGNOSIS — D75839 Thrombocytosis, unspecified: Secondary | ICD-10-CM | POA: Insufficient documentation

## 2020-12-07 DIAGNOSIS — N920 Excessive and frequent menstruation with regular cycle: Secondary | ICD-10-CM | POA: Insufficient documentation

## 2020-12-07 LAB — CBC WITH DIFFERENTIAL (CANCER CENTER ONLY)
Abs Immature Granulocytes: 0.01 10*3/uL (ref 0.00–0.07)
Basophils Absolute: 0 10*3/uL (ref 0.0–0.1)
Basophils Relative: 1 %
Eosinophils Absolute: 0.2 10*3/uL (ref 0.0–0.5)
Eosinophils Relative: 3 %
HCT: 35.5 % — ABNORMAL LOW (ref 36.0–46.0)
Hemoglobin: 11.3 g/dL — ABNORMAL LOW (ref 12.0–15.0)
Immature Granulocytes: 0 %
Lymphocytes Relative: 46 %
Lymphs Abs: 2.4 10*3/uL (ref 0.7–4.0)
MCH: 24.4 pg — ABNORMAL LOW (ref 26.0–34.0)
MCHC: 31.8 g/dL (ref 30.0–36.0)
MCV: 76.7 fL — ABNORMAL LOW (ref 80.0–100.0)
Monocytes Absolute: 0.3 10*3/uL (ref 0.1–1.0)
Monocytes Relative: 6 %
Neutro Abs: 2.3 10*3/uL (ref 1.7–7.7)
Neutrophils Relative %: 44 %
Platelet Count: 327 10*3/uL (ref 150–400)
RBC: 4.63 MIL/uL (ref 3.87–5.11)
RDW: 13.8 % (ref 11.5–15.5)
WBC Count: 5.2 10*3/uL (ref 4.0–10.5)
nRBC: 0 % (ref 0.0–0.2)

## 2020-12-07 LAB — CMP (CANCER CENTER ONLY)
ALT: 21 U/L (ref 0–44)
AST: 14 U/L — ABNORMAL LOW (ref 15–41)
Albumin: 3.9 g/dL (ref 3.5–5.0)
Alkaline Phosphatase: 47 U/L (ref 38–126)
Anion gap: 9 (ref 5–15)
BUN: 10 mg/dL (ref 6–20)
CO2: 27 mmol/L (ref 22–32)
Calcium: 9.3 mg/dL (ref 8.9–10.3)
Chloride: 105 mmol/L (ref 98–111)
Creatinine: 0.85 mg/dL (ref 0.44–1.00)
GFR, Estimated: >60 mL/min (ref >60)
Glucose, Bld: 86 mg/dL (ref 70–99)
Potassium: 3.5 mmol/L (ref 3.5–5.1)
Sodium: 141 mmol/L (ref 135–145)
Total Bilirubin: 0.4 mg/dL (ref 0.3–1.2)
Total Protein: 7 g/dL (ref 6.5–8.1)

## 2020-12-07 LAB — RETIC PANEL
Immature Retic Fract: 16.3 % — ABNORMAL HIGH (ref 2.3–15.9)
RBC.: 4.62 MIL/uL (ref 3.87–5.11)
Retic Count, Absolute: 90.6 10*3/uL (ref 19.0–186.0)
Retic Ct Pct: 2 % (ref 0.4–3.1)
Reticulocyte Hemoglobin: 26.9 pg — ABNORMAL LOW (ref >27.9)

## 2020-12-07 LAB — IRON AND TIBC
Iron: 76 ug/dL (ref 41–142)
Saturation Ratios: 28 % (ref 21–57)
TIBC: 268 ug/dL (ref 236–444)
UIBC: 192 ug/dL (ref 120–384)

## 2020-12-07 LAB — FERRITIN: Ferritin: 230 ng/mL (ref 11–307)

## 2020-12-07 NOTE — Progress Notes (Signed)
[place] [phone]   [phone]) [phone]  PROGRESS NOTE  Patient Care Team: Girtha Rm, NP-C as PCP - General (Family Medicine)  Hematological/Oncological History # Thrombocytosis # Iron Deficiency Anemia 2/2 to GYN Bleeding 07/18/2019: WBC 5.6, Hgb 10.5, MCV 76, Plt 462 03/25/2020: WBC 7.3, Hgb 10.5, MCV 77, Plt 453 06/05/2019: establish care with Dr. Lorenso Courier  06/26/2020-07/24/2020: IV iron sucrose '200mg'$  weekly x 5 doses 08/31/2020: Hgb 12.0, MCV 77.4, Plt 401 12/07/2020: WBC 5.2, Hgb 11.3, MCV 76.7, Plt 327  Interval History:  Sabrina Crane 50 y.o. female with medical history significant for iron deficiency anemia/thrombocytosis presents for a follow up visit. The patient's last visit was on 08/31/2020 at which time she established care. In the interim since the last visit she has continued taking p.o. iron therapy.  On exam today Sabrina Crane reports she is tolerating the p.o. iron therapy well.  She does have some occasional constipation but has not been too difficult to deal with.  She notes that she is not having any other stomach upset or diarrhea.  She does endorse having a good amount of fatigue that has worsened since her administration of IV iron.  She is also having bruising on her feet which she notes has been a chronic issue and has discussed with her primary care provider before.  She denies having any missed doses of her p.o. iron therapy.  Her menstrual cycles have actually slowed down with her last 1 being around Mother's Day.  She is had no other overt bleeding, bruising, or dark stools.  She currently denies any fevers, chills, sweats, nausea, vomiting or diarrhea.  A full 10 point ROS is listed below.  MEDICAL HISTORY:  Past Medical History:  Diagnosis Date   Anemia    Genital herpes    Hypertension    IDA (iron deficiency anemia) 11/12/2019   Thrombocytosis 11/20/2019    SURGICAL HISTORY: Past Surgical History:  Procedure Laterality  Date   CERVICAL BIOPSY  W/ LOOP ELECTRODE EXCISION     CHOLECYSTECTOMY     TONSILLECTOMY AND ADENOIDECTOMY     TUBAL LIGATION      SOCIAL HISTORY: Social History   Socioeconomic History   Marital status: Married    Spouse name: Not on file   Number of children: 4   Years of education: Not on file   Highest education level: Not on file  Occupational History   Occupation: Scientist, water quality  Tobacco Use   Smoking status: Never   Smokeless tobacco: Never  Vaping Use   Vaping Use: Never used  Substance and Sexual Activity   Alcohol use: Never   Drug use: Never   Sexual activity: Yes    Birth control/protection: Surgical  Other Topics Concern   Not on file  Social History Narrative   Not on file   Social Determinants of Health   Financial Resource Strain: Not on file  Food Insecurity: Not on file  Transportation Needs: Not on file  Physical Activity: Not on file  Stress: Not on file  Social Connections: Not on file  Intimate Partner Violence: Not on file    FAMILY HISTORY: Family History  Problem Relation Age of Onset   Cancer Sister        cholangiocarcinoma    Diabetes Mother    Hypertension Mother    Diabetes Father    Kidney disease Half-Brother    Diabetes Half-Brother    Liver disease Half-Brother    HIV Half-Brother  Colon cancer Maternal Uncle    Colon polyps Neg Hx    Esophageal cancer Neg Hx    Rectal cancer Neg Hx    Stomach cancer Neg Hx     ALLERGIES:  is allergic to latex.  MEDICATIONS:  Current Outpatient Medications  Medication Sig Dispense Refill   ASPIRIN PO Take by mouth.     ferrous sulfate 325 (65 FE) MG tablet Take 1 tablet (325 mg total) by mouth daily with breakfast. Please take with a source of Vitamin C 90 tablet 1   losartan-hydrochlorothiazide (HYZAAR) 50-12.5 MG tablet Take 1 tablet by mouth daily. 90 tablet 0   valACYclovir (VALTREX) 1000 MG tablet Take 2 tablets (2,000 mg total) by mouth 2 (two) times daily. X 1 day at onset of  outbreak 30 tablet 2   No current facility-administered medications for this visit.    REVIEW OF SYSTEMS:   Constitutional: ( - ) fevers, ( - )  chills , ( - ) night sweats Eyes: ( - ) blurriness of vision, ( - ) double vision, ( - ) watery eyes Ears, nose, mouth, throat, and face: ( - ) mucositis, ( - ) sore throat Respiratory: ( - ) cough, ( - ) dyspnea, ( - ) wheezes Cardiovascular: ( - ) palpitation, ( - ) chest discomfort, ( - ) lower extremity swelling Gastrointestinal:  ( - ) nausea, ( - ) heartburn, ( - ) change in bowel habits Skin: ( - ) abnormal skin rashes Lymphatics: ( - ) new lymphadenopathy, ( - ) easy bruising Neurological: ( - ) numbness, ( - ) tingling, ( - ) new weaknesses Behavioral/Psych: ( - ) mood change, ( - ) new changes  All other systems were reviewed with the patient and are negative.  PHYSICAL EXAMINATION: ECOG PERFORMANCE STATUS: 1 - Symptomatic but completely ambulatory  Vitals:   12/07/20 1007  BP: 111/74  Pulse: 78  Resp: 18  Temp: (!) 97.1 F (36.2 C)  SpO2: 100%   Filed Weights   12/07/20 1007  Weight: 163 lb 7 oz (74.1 kg)    GENERAL: well appearing middle aged [place] American female. alert, no distress and comfortable SKIN: skin color, texture, turgor are normal, no rashes or significant lesions EYES: conjunctiva are pink and non-injected, sclera clear LUNGS: clear to auscultation and percussion with normal breathing effort HEART: regular rate & rhythm and no murmurs and no lower extremity edema Musculoskeletal: no cyanosis of digits and no clubbing  PSYCH: alert & oriented x 3, fluent speech NEURO: no focal motor/sensory deficits  LABORATORY DATA:  I have reviewed the data as listed CBC Latest Ref Rng & Units 12/07/2020 10/14/2020 08/31/2020  WBC 4.0 - 10.5 K/uL 5.2 5.6 6.6  Hemoglobin 12.0 - 15.0 g/dL 11.3(L) 11.1(L) 12.0  Hematocrit 36.0 - 46.0 % 35.5(L) 34.4(L) 38.0  Platelets 150 - 400 K/uL 327 369 401(H)    CMP Latest Ref  Rng & Units 12/07/2020 10/14/2020 08/31/2020  Glucose 70 - 99 mg/dL 86 116(H) 120(H)  BUN 6 - 20 mg/dL '10 12 12  '$ Creatinine 0.44 - 1.00 mg/dL 0.85 0.77 0.84  Sodium 135 - 145 mmol/L 141 142 140  Potassium 3.5 - 5.1 mmol/L 3.5 3.2(L) 3.2(L)  Chloride 98 - 111 mmol/L 105 106 101  CO2 22 - 32 mmol/L '27 24 24  '$ Calcium 8.9 - 10.3 mg/dL 9.3 8.8(L) 9.1  Total Protein 6.5 - 8.1 g/dL 7.0 6.7 7.4  Total Bilirubin 0.3 - 1.2 mg/dL 0.4 0.3 0.4  Alkaline Phos 38 - 126 U/L 47 47 50  AST 15 - 41 U/L 14(L) 9(L) 13(L)  ALT 0 - 44 U/L '21 12 13    '$ RADIOGRAPHIC STUDIES: No results found.  ASSESSMENT & PLAN Sabrina Crane 50 y.o. female with medical history significant for iron deficiency anemia/thrombocytosis presents for a follow up visit.  After review the labs, review the records, and discussion with the patient the findings are most consistent with a thrombocytosis secondary to iron deficiency anemia.  The patient has heavy menstrual cycles and it is likely that she has not repleting her iron adequately with p.o. therapy.  She was administered 5 doses of IV iron sucrose from 06/26/2020 up until 07/24/2020.  Her hemoglobin improved to low normal range of 12.0. Unfortunately this has dropped to a Hgb 11.3 today.  Interestingly she has not had any menstrual cycle for the last 2 months.  # Thrombocytosis, resolved # Iron Deficiency Anemia 2/2 to GYN Bleeding --patient is s/p IV iron sucrose x 5 doses from 06/26/2020-07/24/2020 --Hgb dropped to 11.3 today. MCV is still low with low reticulocyte hemoglobin --We will order a hemoglobin fractionation cascade in order to help rule out underlying thalassemia. --encourage continued follow up with OB/GYN. She notes no menstrual cycle over the last 2 months.  --recommend continuing PO iron sulfate '325mg'$  daily with a source of a vitamin C  --RTC in 3 months time   Orders Placed This Encounter  Procedures   Hgb Fractionation Cascade    Standing Status:   Future     Standing Expiration Date:   12/07/2021    All questions were answered. The patient knows to call the clinic with any problems, questions or concerns.  A total of more than 30 minutes were spent on this encounter and over half of that time was spent on counseling and coordination of care as outlined above.   Ledell Peoples, MD Department of Hematology/Oncology [place] at [facility] Phone: [phone] Pager: [phone] Email: [email or web address]  12/07/2020 11:23 AM

## 2020-12-08 ENCOUNTER — Ambulatory Visit
Admission: RE | Admit: 2020-12-08 | Discharge: 2020-12-08 | Disposition: A | Discharge status: Home / Self Care | Payer: BC Managed Care – PPO | Source: Ambulatory Visit | Attending: Obstetrics and Gynecology | Admitting: Obstetrics and Gynecology

## 2020-12-08 ENCOUNTER — Encounter: Payer: Self-pay | Admitting: *Deleted

## 2020-12-08 DIAGNOSIS — N6310 Unspecified lump in the right breast, unspecified quadrant: Secondary | ICD-10-CM

## 2020-12-09 ENCOUNTER — Telehealth: Payer: Self-pay | Admitting: Hematology and Oncology

## 2020-12-09 ENCOUNTER — Encounter: Payer: Self-pay | Admitting: Hematology and Oncology

## 2020-12-09 LAB — HGB FRACTIONATION CASCADE
Hgb A2: 2.3 % (ref 1.8–3.2)
Hgb A: 97.7 % (ref 96.4–98.8)
Hgb F: 0 % (ref 0.0–2.0)
Hgb S: 0 %

## 2020-12-09 NOTE — Telephone Encounter (Signed)
Scheduled per los. Called, not able to leave msg. Mailed printout  

## 2020-12-24 ENCOUNTER — Ambulatory Visit: Payer: BC Managed Care – PPO | Admitting: Medical

## 2020-12-24 ENCOUNTER — Other Ambulatory Visit: Payer: Self-pay

## 2020-12-24 ENCOUNTER — Ambulatory Visit
Admission: RE | Admit: 2020-12-24 | Discharge: 2020-12-24 | Disposition: A | Discharge status: Home / Self Care | Payer: BC Managed Care – PPO | Source: Ambulatory Visit | Attending: Medical | Admitting: Medical

## 2020-12-24 VITALS — BP 120/78 | HR 78 | Temp 98.0°F | Wt 166.0 lb

## 2020-12-24 DIAGNOSIS — D500 Iron deficiency anemia secondary to blood loss (chronic): Secondary | ICD-10-CM | POA: Diagnosis not present

## 2020-12-24 DIAGNOSIS — D75839 Thrombocytosis, unspecified: Secondary | ICD-10-CM

## 2020-12-24 DIAGNOSIS — M25561 Pain in right knee: Secondary | ICD-10-CM

## 2020-12-24 DIAGNOSIS — D692 Other nonthrombocytopenic purpura: Secondary | ICD-10-CM

## 2020-12-24 DIAGNOSIS — G8929 Other chronic pain: Secondary | ICD-10-CM

## 2020-12-24 NOTE — Progress Notes (Signed)
Subjective:  Sabrina Crane is a 50 y.o. female who presents for Chief Complaint  Patient presents with   Knee Pain    Knee pain  for a while. Spot on legs are back.      Here for right knee pain x months.   No swelling . Bending down and getting back up hurts.  No fall , no injury.  No ankle or hip pain.  Using ibuprofen sometimes.  Bends stocking coolers at gas station.  No other exercise.    She notes some recurrence of some skin spots on her legs.  Not itchy.  He had discussed this back in the fall of last year.  She does not have any spots right now but they have been coming back.  Candidate referring her to hematology about the rash.  There was concern about menstrual bleeding.  She has been seeing gynecology but she actually has not had any periods since May.  So right now she is not having menstrual bleeding  No other aggravating or relieving factors.    No other c/o.  The following portions of the patient's history were reviewed and updated as appropriate: allergies, current medications, past family history, past medical history, past social history, past surgical history and problem list.  ROS Otherwise as in subjective above  Objective: BP 120/78   Pulse 78   Temp 98 F (36.7 C)   Wt 166 lb (75.3 kg)   BMI 28.49 kg/m   General appearance: alert, no distress, well developed, well nourished, african [place] female Skin no obvious purpura or skin lesions today of the legs or other  Right knee nontender, no swelling, no laxity, no obvious deformity.  No pain with special test.  Negative meniscus signs, negative McMurray's.  No hip pain or ankle pain, normal range of motion.  However when she bends down close to 30 degrees or more flexion of the knee she has pain in the knee bending and getting back up.  Rest of legs neurovascularly intact   Assessment: Encounter Diagnoses  Name Primary?   Chronic pain of right knee Yes   Thrombocytosis    Iron deficiency anemia due to  chronic blood loss    Purpura (HCC)      Plan: Chronic knee pain-sent for x-ray.  We discussed likely issue being patellofemoral syndrome.  I discussed some stretches and strengthening exercises for this.  We discussed short-term use of RICE including rest, ice, possible knee sleeve, elevation.  Can use Tylenol or Aleve as needed short-term.  Thrombocytosis and iron deficiency anemia, purpura -I reviewed the chart record.  She had been referred to hematology for thrombocytosis and purpura findings.  They felt like this was due to iron deficiency anemia from heavy menstrual bleeding.  She had a series of iron infusions.  She has follow-up with hematology in October.  Lately she has not had any menstrual bleeding.  Hopefully on recheck hemoglobin and platelets will be improving.  No current purpura rash  Sabrina Crane was seen today for knee pain.  Diagnoses and all orders for this visit:  Chronic pain of right knee -     DG Knee Complete 4 Views Right; Future  Thrombocytosis  Iron deficiency anemia due to chronic blood loss  Purpura ([place])   Follow up: pending xray

## 2020-12-24 NOTE — Patient Instructions (Signed)
Please go to [facility] for your right knee xray.   Their hours are 8am - 4:30 pm Monday - Friday.  Take your insurance card with you.  [facility] [phone]  [address], [place], [place] [address]  [address], [place] [address]       Patellofemoral Pain Syndrome  Patellofemoral pain syndrome is a condition in which the tissue (cartilage) on the underside of the kneecap (patella) softens or breaks down. This causes pain in the front of the knee. The condition is also called runner's knee or chondromalacia patella. Patellofemoral pain syndrome is most common in young adults who are active insports. The knee is the largest joint in the body. The patella covers the front of the knee and is attached to muscles above and below the knee. The underside of the patella is covered with a smooth type of cartilage (synovium). The smooth surface helps the patella glide easily when you move your knee. Patellofemoral pain syndrome causes swelling in the joint linings and bonesurfaces in the knee. What are the causes? This condition may be caused by: Overuse of the knee. Poor alignment of your knee joints. Weak leg muscles. A direct hit to your kneecap. What increases the risk? You are more likely to develop this condition if: You do a lot of activities that can wear down your kneecap. These include: Running. Squatting. Climbing stairs. You start a new physical activity or exercise program. You wear shoes that do not fit well. You do not have good leg strength. You are overweight. What are the signs or symptoms? The main symptom of this condition is knee pain. This may feel like a dull, aching pain underneath your patella, in the front of your knee. There may be a popping or cracking sound when you move your knee. Pain may get worse with: Exercise. Climbing stairs. Running. Jumping. Squatting. Kneeling. Sitting for a long time. Moving or pushing  on your patella. How is this diagnosed? This condition may be diagnosed based on: Your symptoms and medical history. You may be asked about your recent physical activities and which ones cause knee pain. A physical exam. This may include: Moving your patella back and forth. Checking your range of knee motion. Having you squat or jump to see if you have pain. Checking the strength of your leg muscles. Imaging tests to confirm the diagnosis. These may include an MRI of your knee. How is this treated? This condition may be treated at home with rest, ice, compression, andelevation (RICE).  Other treatments may include: NSAIDs, such as ibuprofen. Physical therapy to stretch and strengthen your leg muscles. Shoe inserts (orthotics) to take stress off your knee. A knee brace or knee support. Adhesive tapes to the skin. Surgery to remove damaged cartilage or move the patella to a better position. This is rare. Follow these instructions at home: If you have a brace: Wear the brace as told by your health care provider. Remove it only as told by your health care provider. Loosen the brace if your toes tingle, become numb, or turn cold and blue. Keep the brace clean. If the brace is not waterproof: Do not let it get wet. Cover it with a watertight covering when you take a bath or a shower. Managing pain, stiffness, and swelling  If directed, put ice on the painful area. To do this: If you have a removable brace, remove it as told by your health care provider. Put ice in  a plastic bag. Place a towel between your skin and the bag. Leave the ice on for 20 minutes, 2-3 times a day. Remove the ice if your skin turns bright red. This is very important. If you cannot feel pain, heat, or cold, you have a greater risk of damage to the area. Move your toes often to reduce stiffness and swelling. Raise (elevate) the injured area above the level of your heart while you are sitting or lying  down.  Activity Rest your knee. Avoid activities that cause knee pain. Perform stretching and strengthening exercises as told by your health care provider or physical therapist. Return to your normal activities as told by your health care provider. Ask your health care provider what activities are safe for you. General instructions Take over-the-counter and prescription medicines only as told by your health care provider. Use splints, braces, knee supports, or walking aids as directed by your health care provider. Do not use any products that contain nicotine or tobacco, such as cigarettes, e-cigarettes, and chewing tobacco. These can delay healing. If you need help quitting, ask your health care provider. Keep all follow-up visits. This is important. Contact a health care provider if: Your symptoms get worse. You are not improving with home care. Summary Patellofemoral pain syndrome is a condition in which the tissue (cartilage) on the underside of the kneecap (patella) softens or breaks down. This condition causes swelling in the joint linings and bone surfaces in the knee. This leads to pain in the front of the knee. This condition may be treated at home with rest, ice, compression, and elevation (RICE). Use splints, braces, knee supports, or walking aids as directed by your health care provider. This information is not intended to replace advice given to you by your health care provider. Make sure you discuss any questions you have with your healthcare provider. Document Revised: 10/16/2019 Document Reviewed: 10/16/2019 Elsevier Patient Education  2022 [employer].

## 2021-02-11 ENCOUNTER — Other Ambulatory Visit: Payer: Self-pay | Admitting: Family Medicine

## 2021-02-11 DIAGNOSIS — I10 Essential (primary) hypertension: Secondary | ICD-10-CM

## 2021-02-19 ENCOUNTER — Other Ambulatory Visit: Payer: Self-pay

## 2021-02-19 ENCOUNTER — Telehealth: Payer: Self-pay | Admitting: Family Medicine

## 2021-02-19 DIAGNOSIS — I10 Essential (primary) hypertension: Secondary | ICD-10-CM

## 2021-02-19 MED ORDER — LOSARTAN POTASSIUM-HCTZ 50-12.5 MG PO TABS: 1.0000 | ORAL_TABLET | Freq: Every day | ORAL | 0 refills | Status: AC

## 2021-02-19 NOTE — Telephone Encounter (Signed)
Pt just moved back to [facility] and she is requesting a refill on the Losartan- hydro sent to [facility] at. [address] [place] [address]. The phone number is [phone]

## 2021-03-08 ENCOUNTER — Inpatient Hospital Stay: Payer: BC Managed Care – PPO

## 2021-03-08 ENCOUNTER — Other Ambulatory Visit: Payer: Self-pay

## 2021-03-08 ENCOUNTER — Other Ambulatory Visit: Payer: Self-pay | Admitting: Hematology and Oncology

## 2021-03-08 ENCOUNTER — Inpatient Hospital Stay: Payer: BC Managed Care – PPO | Attending: Hematology and Oncology | Admitting: Hematology and Oncology

## 2021-03-08 VITALS — BP 112/82 | HR 86 | Temp 97.6°F | Resp 18 | Wt 165.9 lb

## 2021-03-08 DIAGNOSIS — R718 Other abnormality of red blood cells: Secondary | ICD-10-CM

## 2021-03-08 DIAGNOSIS — D500 Iron deficiency anemia secondary to blood loss (chronic): Secondary | ICD-10-CM

## 2021-03-08 DIAGNOSIS — N920 Excessive and frequent menstruation with regular cycle: Secondary | ICD-10-CM | POA: Insufficient documentation

## 2021-03-08 DIAGNOSIS — D75839 Thrombocytosis, unspecified: Secondary | ICD-10-CM | POA: Diagnosis present

## 2021-03-08 LAB — CBC WITH DIFFERENTIAL (CANCER CENTER ONLY)
Abs Immature Granulocytes: 0.02 10*3/uL (ref 0.00–0.07)
Basophils Absolute: 0.1 10*3/uL (ref 0.0–0.1)
Basophils Relative: 1 %
Eosinophils Absolute: 0.1 10*3/uL (ref 0.0–0.5)
Eosinophils Relative: 1 %
HCT: 39.3 % (ref 36.0–46.0)
Hemoglobin: 12.5 g/dL (ref 12.0–15.0)
Immature Granulocytes: 0 %
Lymphocytes Relative: 41 %
Lymphs Abs: 2.8 10*3/uL (ref 0.7–4.0)
MCH: 24.6 pg — ABNORMAL LOW (ref 26.0–34.0)
MCHC: 31.8 g/dL (ref 30.0–36.0)
MCV: 77.4 fL — ABNORMAL LOW (ref 80.0–100.0)
Monocytes Absolute: 0.3 10*3/uL (ref 0.1–1.0)
Monocytes Relative: 5 %
Neutro Abs: 3.6 10*3/uL (ref 1.7–7.7)
Neutrophils Relative %: 52 %
Platelet Count: 393 10*3/uL (ref 150–400)
RBC: 5.08 MIL/uL (ref 3.87–5.11)
RDW: 13.6 % (ref 11.5–15.5)
WBC Count: 7 10*3/uL (ref 4.0–10.5)
nRBC: 0 % (ref 0.0–0.2)

## 2021-03-08 LAB — RETIC PANEL
Immature Retic Fract: 21 % — ABNORMAL HIGH (ref 2.3–15.9)
RBC.: 5.05 MIL/uL (ref 3.87–5.11)
Retic Count, Absolute: 116.7 10*3/uL (ref 19.0–186.0)
Retic Ct Pct: 2.3 % (ref 0.4–3.1)
Reticulocyte Hemoglobin: 26.9 pg — ABNORMAL LOW (ref >27.9)

## 2021-03-08 LAB — CMP (CANCER CENTER ONLY)
ALT: 17 U/L (ref 0–44)
AST: 13 U/L — ABNORMAL LOW (ref 15–41)
Albumin: 4.7 g/dL (ref 3.5–5.0)
Alkaline Phosphatase: 50 U/L (ref 38–126)
Anion gap: 11 (ref 5–15)
BUN: 12 mg/dL (ref 6–20)
CO2: 26 mmol/L (ref 22–32)
Calcium: 9.7 mg/dL (ref 8.9–10.3)
Chloride: 100 mmol/L (ref 98–111)
Creatinine: 0.81 mg/dL (ref 0.44–1.00)
GFR, Estimated: >60 mL/min (ref >60)
Glucose, Bld: 100 mg/dL — ABNORMAL HIGH (ref 70–99)
Potassium: 3.2 mmol/L — ABNORMAL LOW (ref 3.5–5.1)
Sodium: 137 mmol/L (ref 135–145)
Total Bilirubin: 0.5 mg/dL (ref 0.3–1.2)
Total Protein: 8.1 g/dL (ref 6.5–8.1)

## 2021-03-08 LAB — IRON AND TIBC
Iron: 81 ug/dL (ref 28–170)
Saturation Ratios: 25 % (ref 10.4–31.8)
TIBC: 328 ug/dL (ref 250–450)
UIBC: 247 ug/dL

## 2021-03-08 LAB — FERRITIN: Ferritin: 171 ng/mL (ref 11–307)

## 2021-03-15 ENCOUNTER — Encounter: Payer: Self-pay | Admitting: Hematology and Oncology

## 2021-03-15 NOTE — Progress Notes (Signed)
[facility] [phone]) [phone]   Fax:(336) [phone]  PROGRESS NOTE  Patient Care Team: Davy Pique as PCP - General (Family Medicine)  Hematological/Oncological History # Thrombocytosis # Iron Deficiency Anemia 2/2 to GYN Bleeding 07/18/2019: WBC 5.6, Hgb 10.5, MCV 76, Plt 462 03/25/2020: WBC 7.3, Hgb 10.5, MCV 77, Plt 453 06/05/2019: establish care with Dr. Lorenso Courier  06/26/2020-07/24/2020: IV iron sucrose 200mg  weekly x 5 doses 08/31/2020: Hgb 12.0, MCV 77.4, Plt 401 12/07/2020: WBC 5.2, Hgb 11.3, MCV 76.7, Plt 327  Interval History:  Sabrina Crane 50 y.o. female with medical history significant for iron deficiency anemia/thrombocytosis presents for a follow up visit. The patient's last visit was on 12/07/2020. In the interim since the last visit she has continued taking p.o. iron therapy.  On exam today Sabrina Crane states that she is "okay I reckon".  She notes that she did have her menstrual cycles restarted after having been gone for 5 months.  She was offered an IUD but "wants to pass on that".  She notes otherwise she has been feeling okay.  Energy has been good and she has not been having any issues with shortness of breath.  She cannot tell a whole lot of difference with her increased hemoglobin.  She has been trying to eat salads, liver,.  She notes that her last menstrual cycle started on 02/22/2021 and then it was "heavy heavy".  She notes that she is not having any bleeding elsewhere in her body.  She currently denies any fevers, chills, sweats, nausea, vomiting or diarrhea.  A full 10 point ROS is listed below.  MEDICAL HISTORY:  Past Medical History:  Diagnosis Date   Anemia    Genital herpes    Hypertension    IDA (iron deficiency anemia) 11/12/2019   Thrombocytosis 11/20/2019    SURGICAL HISTORY: Past Surgical History:  Procedure Laterality Date   CERVICAL BIOPSY  W/ LOOP ELECTRODE EXCISION     CHOLECYSTECTOMY     TONSILLECTOMY AND  ADENOIDECTOMY     TUBAL LIGATION      SOCIAL HISTORY: Social History   Socioeconomic History   Marital status: Married    Spouse name: Not on file   Number of children: 4   Years of education: Not on file   Highest education level: Not on file  Occupational History   Occupation: Scientist, water quality  Tobacco Use   Smoking status: Never   Smokeless tobacco: Never  Vaping Use   Vaping Use: Never used  Substance and Sexual Activity   Alcohol use: Never   Drug use: Never   Sexual activity: Yes    Birth control/protection: Surgical  Other Topics Concern   Not on file  Social History Narrative   Not on file   Social Determinants of Health   Financial Resource Strain: Not on file  Food Insecurity: Not on file  Transportation Needs: Not on file  Physical Activity: Not on file  Stress: Not on file  Social Connections: Not on file  Intimate Partner Violence: Not on file    FAMILY HISTORY: Family History  Problem Relation Age of Onset   Cancer Sister        cholangiocarcinoma    Diabetes Mother    Hypertension Mother    Diabetes Father    Kidney disease Half-Brother    Diabetes Half-Brother    Liver disease Half-Brother    HIV Half-Brother    Colon cancer Maternal Uncle    Colon polyps Neg Hx  Esophageal cancer Neg Hx    Rectal cancer Neg Hx    Stomach cancer Neg Hx     ALLERGIES:  is allergic to latex.  MEDICATIONS:  Current Outpatient Medications  Medication Sig Dispense Refill   ASPIRIN PO Take by mouth.     losartan-hydrochlorothiazide (HYZAAR) 50-12.5 MG tablet Take 1 tablet by mouth daily. 90 tablet 0   valACYclovir (VALTREX) 1000 MG tablet Take 2 tablets (2,000 mg total) by mouth 2 (two) times daily. X 1 day at onset of outbreak 30 tablet 2   ferrous sulfate 325 (65 FE) MG tablet Take 1 tablet (325 mg total) by mouth daily with breakfast. Please take with a source of Vitamin C (Patient not taking: Reported on 03/08/2021) 90 tablet 1   No current  facility-administered medications for this visit.    REVIEW OF SYSTEMS:   Constitutional: ( - ) fevers, ( - )  chills , ( - ) night sweats Eyes: ( - ) blurriness of vision, ( - ) double vision, ( - ) watery eyes Ears, nose, mouth, throat, and face: ( - ) mucositis, ( - ) sore throat Respiratory: ( - ) cough, ( - ) dyspnea, ( - ) wheezes Cardiovascular: ( - ) palpitation, ( - ) chest discomfort, ( - ) lower extremity swelling Gastrointestinal:  ( - ) nausea, ( - ) heartburn, ( - ) change in bowel habits Skin: ( - ) abnormal skin rashes Lymphatics: ( - ) new lymphadenopathy, ( - ) easy bruising Neurological: ( - ) numbness, ( - ) tingling, ( - ) new weaknesses Behavioral/Psych: ( - ) mood change, ( - ) new changes  All other systems were reviewed with the patient and are negative.  PHYSICAL EXAMINATION: ECOG PERFORMANCE STATUS: 1 - Symptomatic but completely ambulatory  Vitals:   03/08/21 1236  BP: 112/82  Pulse: 86  Resp: 18  Temp: 97.6 F (36.4 C)  SpO2: 100%   Filed Weights   03/08/21 1236  Weight: 165 lb 14.4 oz (75.3 kg)    GENERAL: well appearing middle aged [place] American female. alert, no distress and comfortable SKIN: skin color, texture, turgor are normal, no rashes or significant lesions EYES: conjunctiva are pink and non-injected, sclera clear LUNGS: clear to auscultation and percussion with normal breathing effort HEART: regular rate & rhythm and no murmurs and no lower extremity edema Musculoskeletal: no cyanosis of digits and no clubbing  PSYCH: alert & oriented x 3, fluent speech NEURO: no focal motor/sensory deficits  LABORATORY DATA:  I have reviewed the data as listed CBC Latest Ref Rng & Units 03/08/2021 12/07/2020 10/14/2020  WBC 4.0 - 10.5 K/uL 7.0 5.2 5.6  Hemoglobin 12.0 - 15.0 g/dL 12.5 11.3(L) 11.1(L)  Hematocrit 36.0 - 46.0 % 39.3 35.5(L) 34.4(L)  Platelets 150 - 400 K/uL 393 327 369    CMP Latest Ref Rng & Units 03/08/2021 12/07/2020 10/14/2020   Glucose 70 - 99 mg/dL 100(H) 86 116(H)  BUN 6 - 20 mg/dL 12 10 12   Creatinine 0.44 - 1.00 mg/dL 0.81 0.85 0.77  Sodium 135 - 145 mmol/L 137 141 142  Potassium 3.5 - 5.1 mmol/L 3.2(L) 3.5 3.2(L)  Chloride 98 - 111 mmol/L 100 105 106  CO2 22 - 32 mmol/L 26 27 24   Calcium 8.9 - 10.3 mg/dL 9.7 9.3 8.8(L)  Total Protein 6.5 - 8.1 g/dL 8.1 7.0 6.7  Total Bilirubin 0.3 - 1.2 mg/dL 0.5 0.4 0.3  Alkaline Phos 38 - 126 U/L 50 47  47  AST 15 - 41 U/L 13(L) 14(L) 9(L)  ALT 0 - 44 U/L 17 21 12     RADIOGRAPHIC STUDIES: No results found.  ASSESSMENT & PLAN Sabrina Crane 50 y.o. female with medical history significant for iron deficiency anemia/thrombocytosis presents for a follow up visit.  After review the labs, review the records, and discussion with the patient the findings are most consistent with a thrombocytosis secondary to iron deficiency anemia.  The patient has heavy menstrual cycles and it is likely that she has not repleting her iron adequately with p.o. therapy.  She was administered 5 doses of IV iron sucrose from 06/26/2020 up until 07/24/2020.  Her hemoglobin improved to low normal range of 12.5, but microcytosis and retic Hgb remain low. Iron and TIBC have normalized.   # Thrombocytosis, resolved # Iron Deficiency Anemia 2/2 to GYN Bleeding --patient is s/p IV iron sucrose x 5 doses from 06/26/2020-07/24/2020 --Hgb increased to 12.5. MCV is still low with low reticulocyte hemoglobin --We will order a Alpha thalassemia genotyping in order to help rule out underlying thalassemia. --encourage continued follow up with OB/GYN.  --recommend continuing PO iron sulfate 325mg  daily with a source of a vitamin C  --RTC in 6 months time  No orders of the defined types were placed in this encounter.   All questions were answered. The patient knows to call the clinic with any problems, questions or concerns.  A total of more than 30 minutes were spent on this encounter and over half of that  time was spent on counseling and coordination of care as outlined above.   Ledell Peoples, MD Department of Hematology/Oncology [place] [facility] Phone: [phone] Pager: [phone] Email: [email or web address]  03/15/2021 11:06 AM

## 2021-03-18 LAB — ALPHA-THALASSEMIA GENOTYPR

## 2021-09-06 ENCOUNTER — Inpatient Hospital Stay: Payer: BC Managed Care – PPO | Attending: Family Medicine

## 2021-09-06 ENCOUNTER — Inpatient Hospital Stay: Payer: BC Managed Care – PPO | Admitting: Hematology and Oncology

## 2021-09-06 ENCOUNTER — Other Ambulatory Visit: Payer: Self-pay | Admitting: Hematology and Oncology

## 2021-09-06 DIAGNOSIS — D500 Iron deficiency anemia secondary to blood loss (chronic): Secondary | ICD-10-CM

## 2022-02-18 IMAGING — US US BREAST*R* LIMITED INC AXILLA
1 series · 12 of 12 positions shown · non-contrast
Comparison: Previous exam(s).

CLINICAL DATA: 49-year-old female recalled from screening mammogram
dated 05/04/2020 for possible right breast masses.

EXAM:
DIGITAL DIAGNOSTIC RIGHT MAMMOGRAM WITH CAD AND TOMOSYNTHESIS
ULTRASOUND BREAST RIGHT
TECHNIQUE: Right digital diagnostic mammography and breast tomosynthesis was
performed. Digital images of the right breast were evaluated with
computer-aided detection. Targeted ultrasound examination of the
right breast was performed.

[Series 1: us breast*right* limited inc axilla · 0.07mm/px · 12 of 12 slices shown]
[im 1/12]
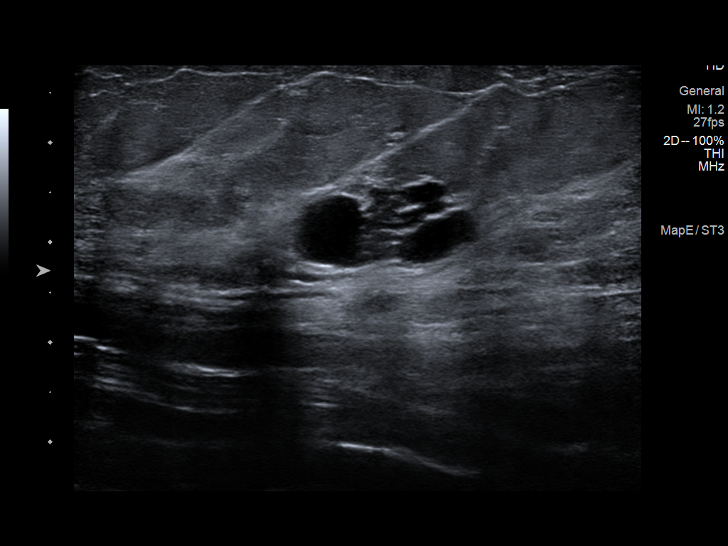
[im 2/12]
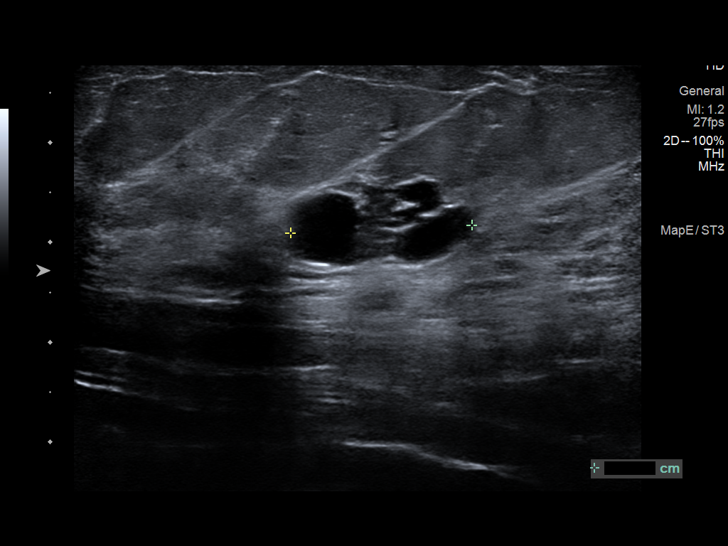
[im 3/12]
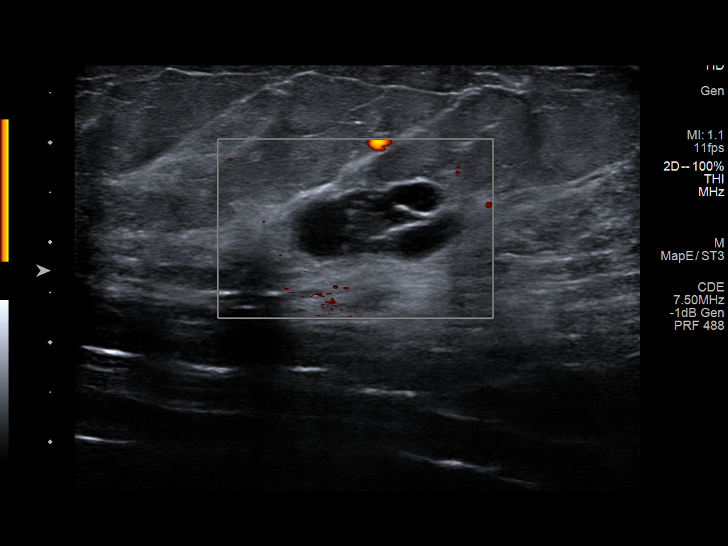
[im 4/12]
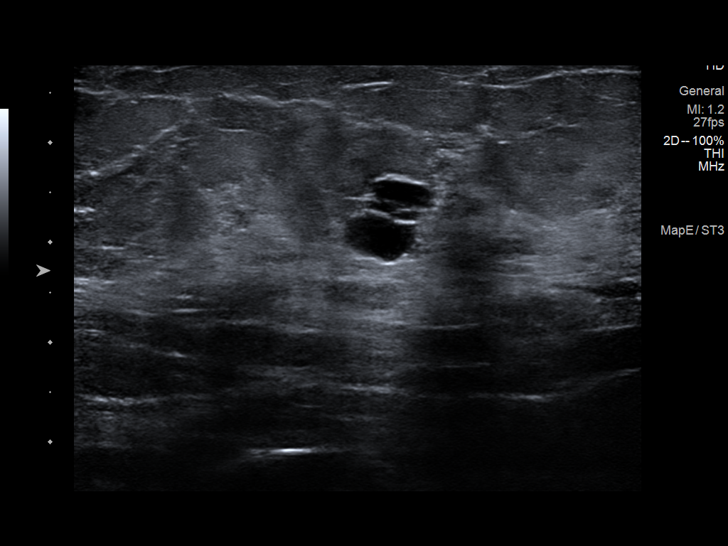
[im 5/12]
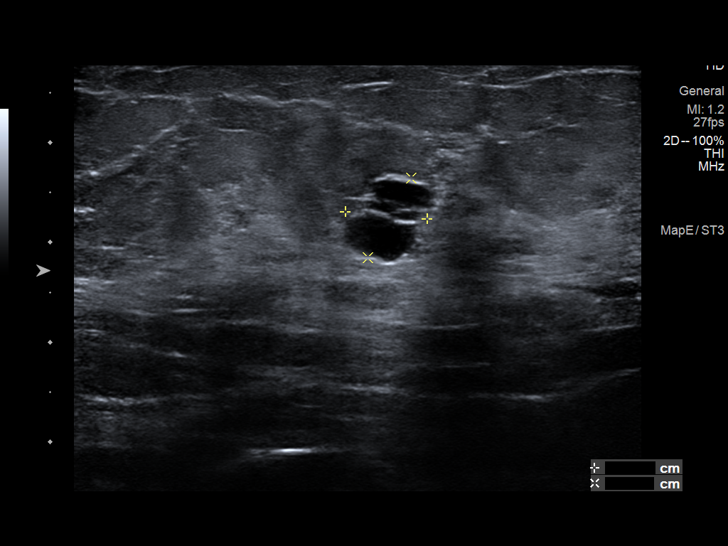
[im 6/12]
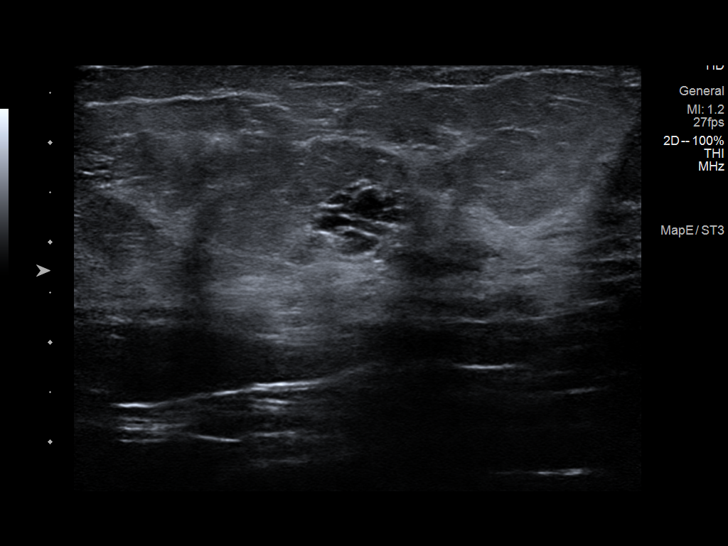
[im 7/12]
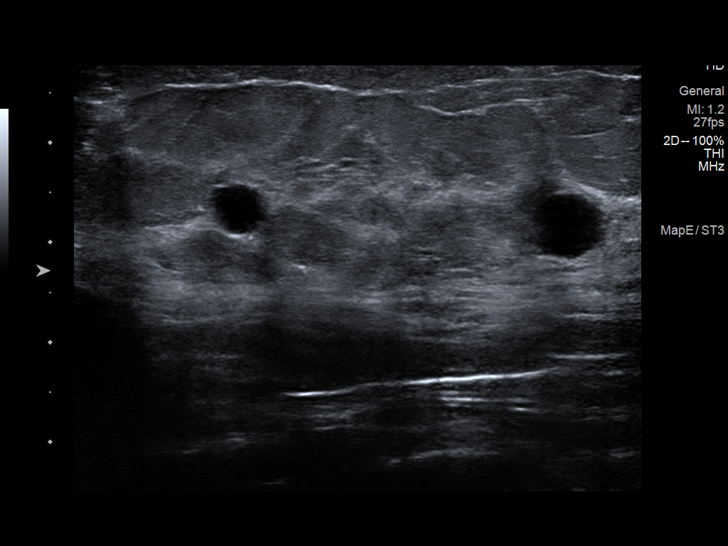
[im 8/12]
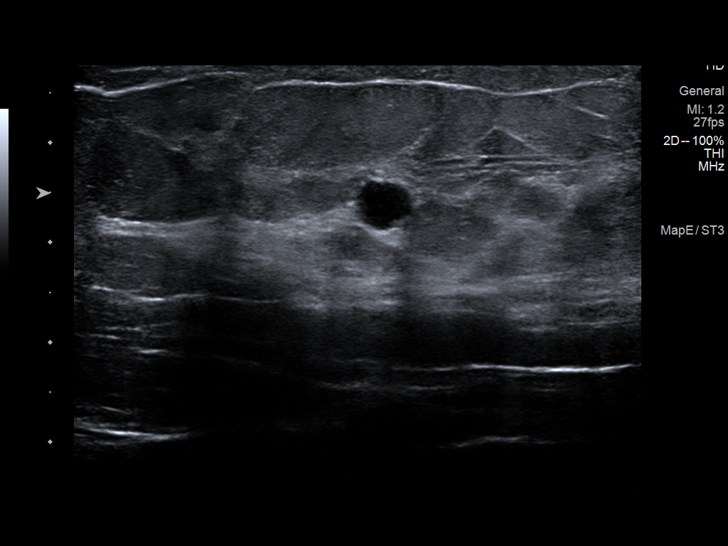
[im 9/12]
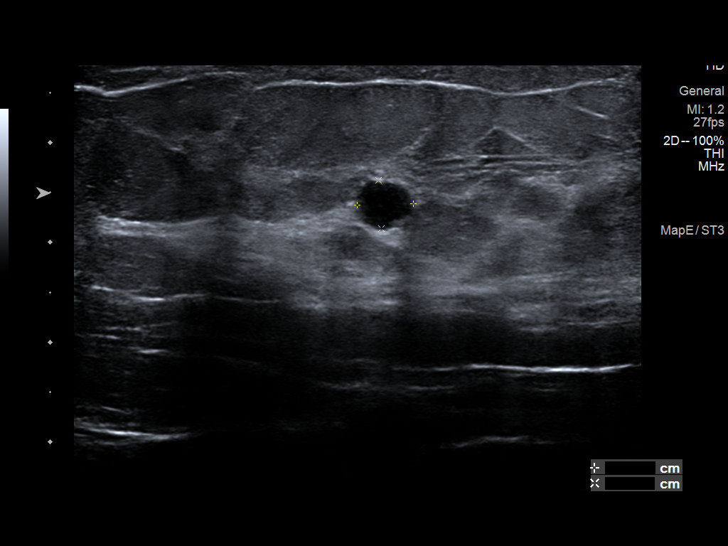
[im 10/12]
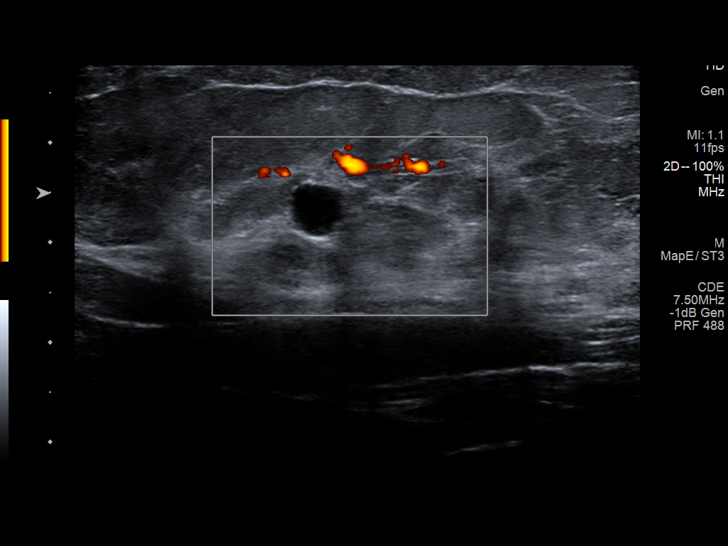
[im 11/12]
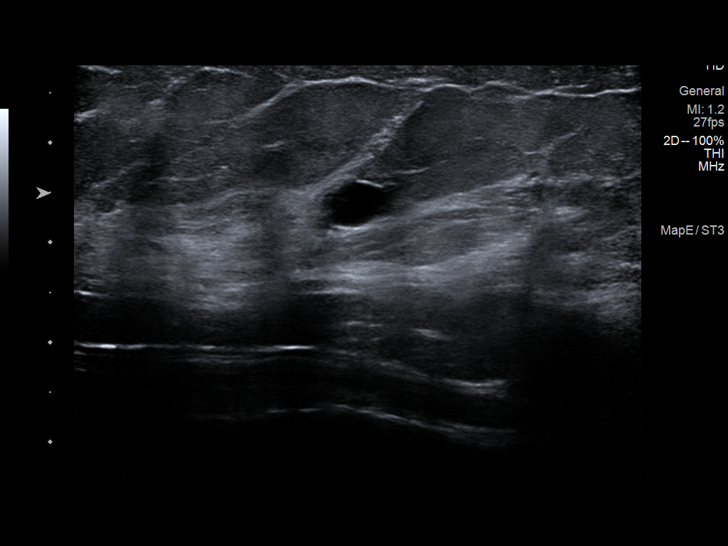
[im 12/12]
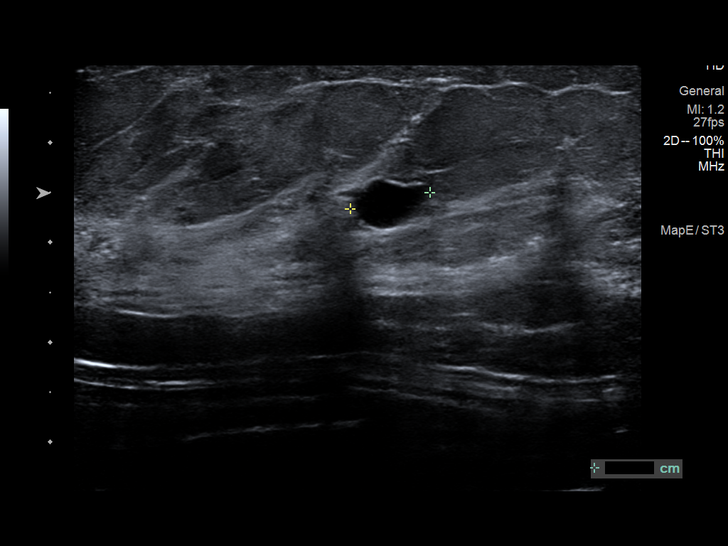

[12 of 12 positions shown; findings below may reference images not displayed]

ACR Breast Density Category c: The breast tissue is heterogeneously
dense, which may obscure small masses.
FINDINGS: There are 2 persistent oval, circumscribed equal density masses in
the upper-outer quadrant of the right breast. Further evaluation
with ultrasound was performed.

Targeted ultrasound is performed, showing an oval, circumscribed
multi-cystic mass at the 10 o'clock position 4 cm from the nipple.
It measures 1.8 x 0.9 x 0.8 cm. There is no internal vascularity.
This correlates with the larger of the 2 mammographically identified
masses. A second oval, circumscribed anechoic mass is identified at
the 10 o'clock position 4 cm from the nipple. It measures 0.8 x
x 0.5 cm. There is no internal vascularity. This correlates with the
second mammographically identified mass.
IMPRESSION: 1. Probably benign, probable cluster of cysts at the 10 o'clock
position 4 cm from the nipple. Recommendation is for short-term
interval follow-up.
2. Additional benign, simple cyst at the 10 o'clock position 4 cm
from the nipple. No further imaging follow-up required.

RECOMMENDATION:
Right breast ultrasound in 6 months.

I have discussed the findings and recommendations with the patient.
If applicable, a reminder letter will be sent to the patient
regarding the next appointment.

BI-RADS CATEGORY  3: Probably benign.

## 2022-09-10 IMAGING — CR DG KNEE COMPLETE 4+V*R*
4 series · 4 of 4 positions shown · non-contrast
Comparison: None.

CLINICAL DATA: Chronic right knee pain

EXAM:
RIGHT KNEE - COMPLETE 4+ VIEW

[w knee ap right]
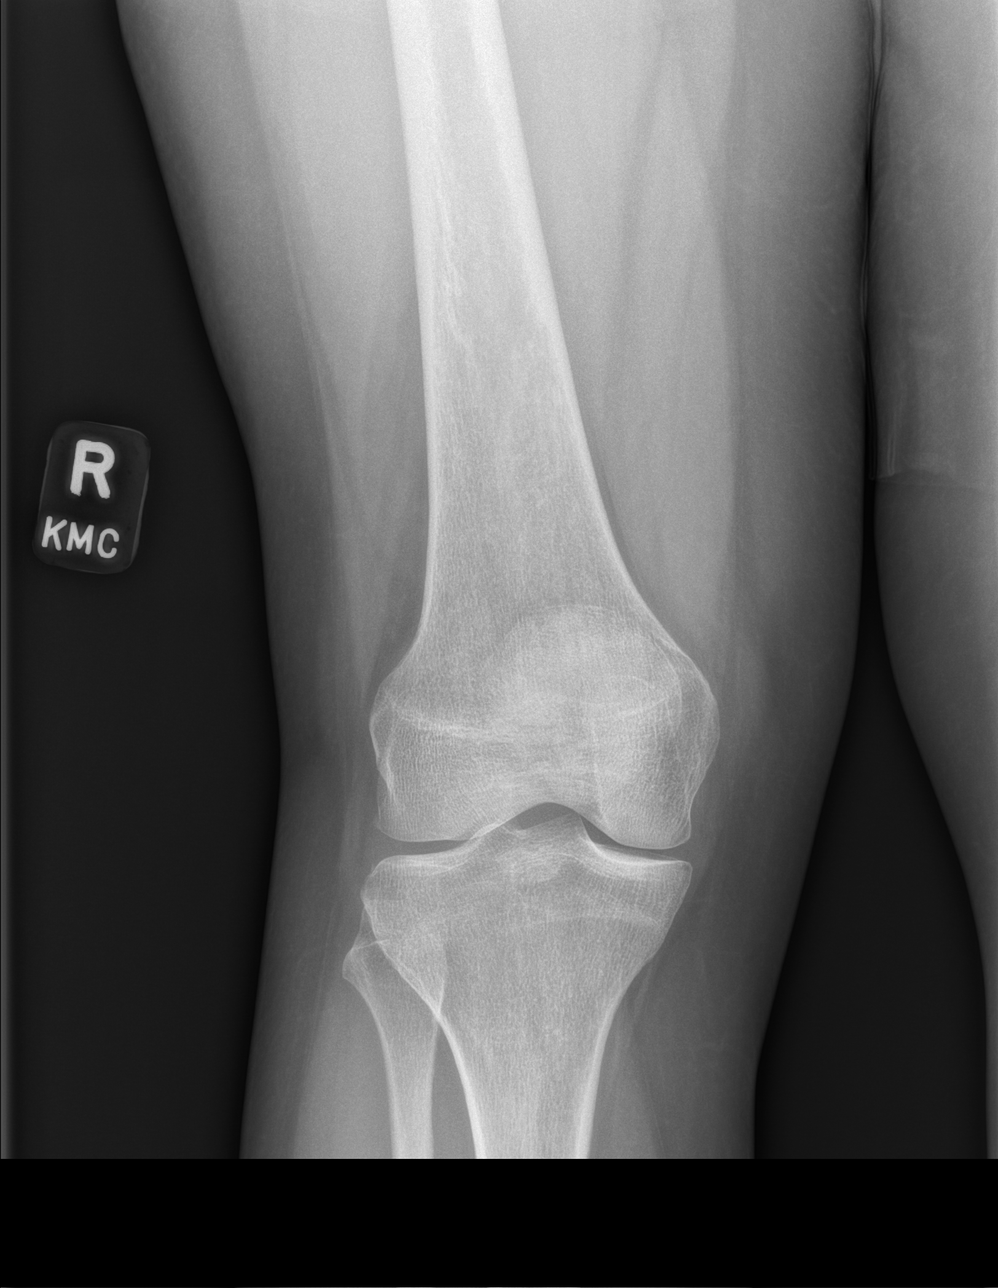

[w knee lat right]
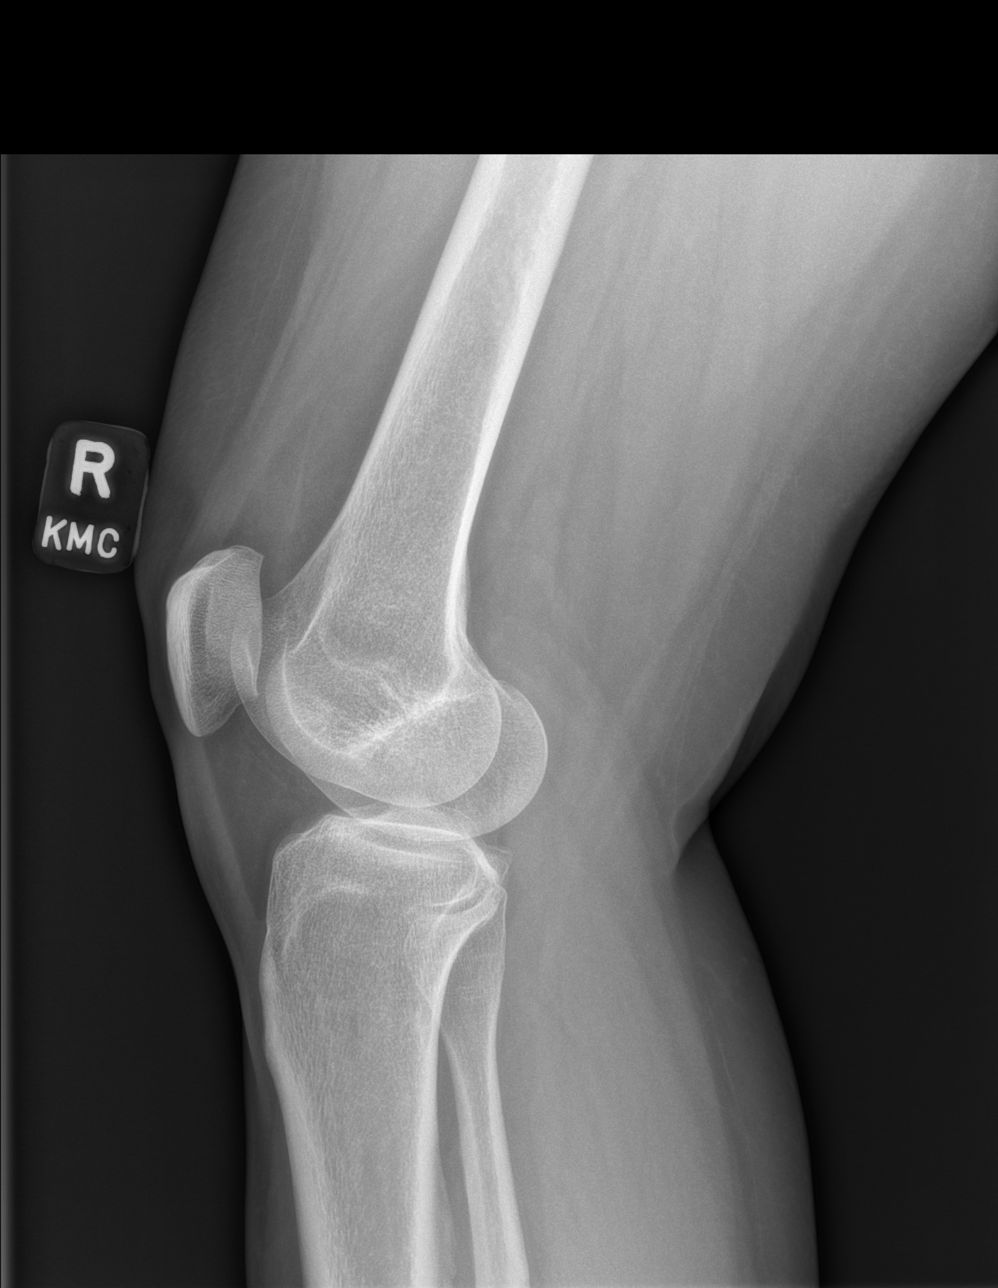

[w knee tunnel pa right]
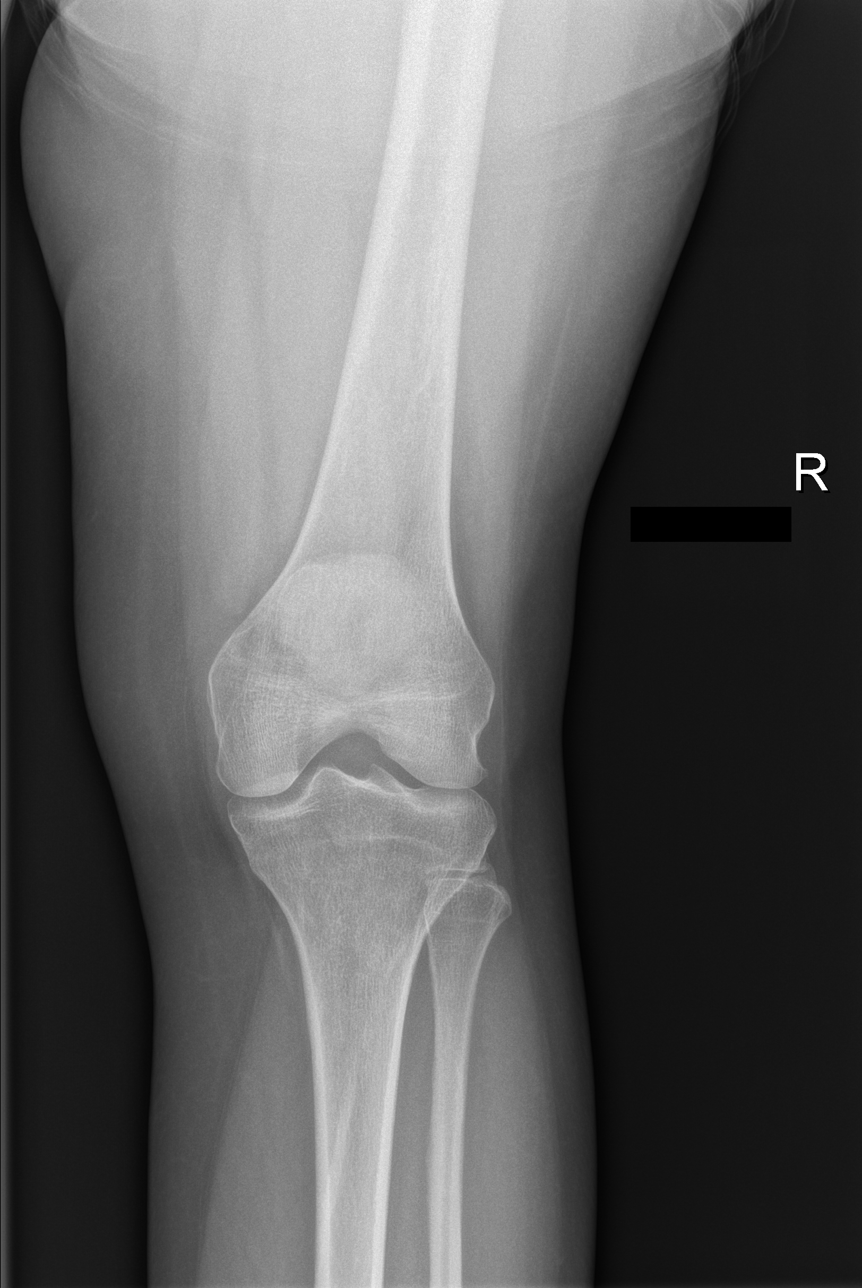

[x knee sunrise right]
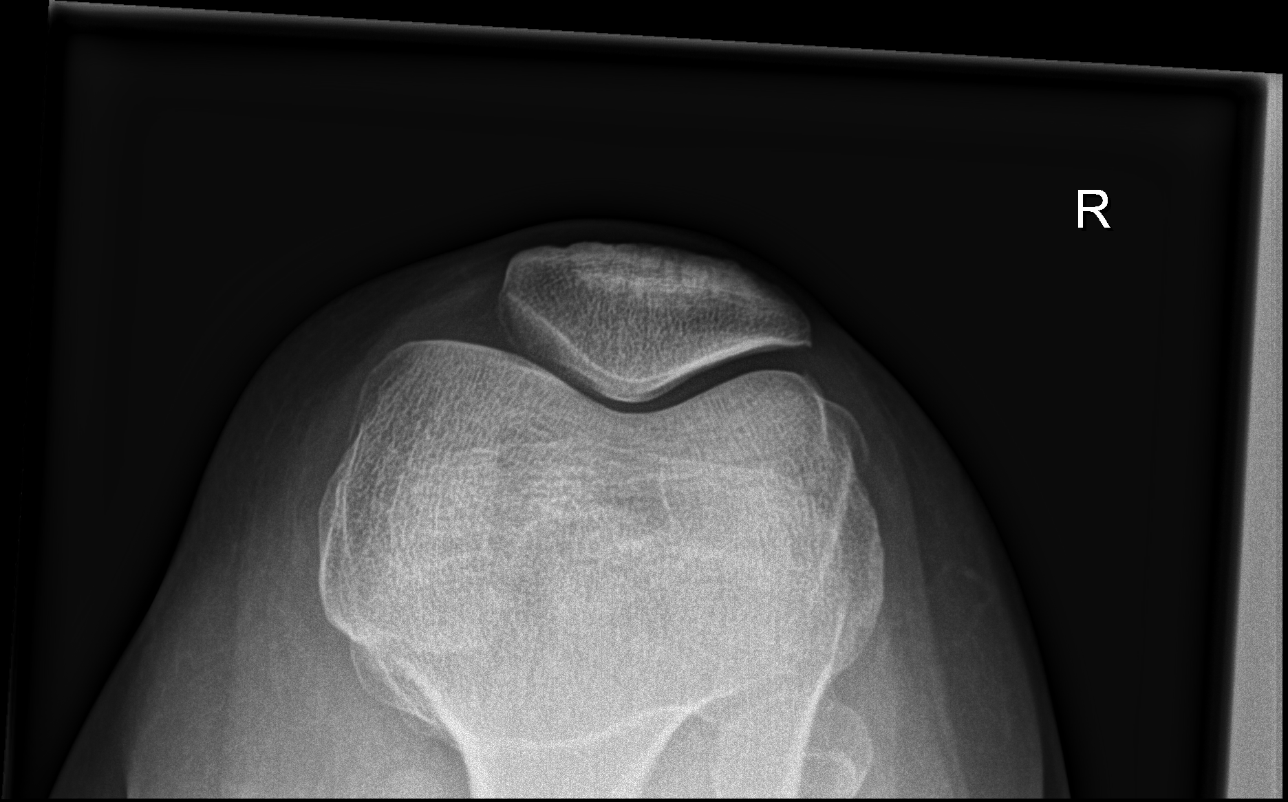

[4 of 4 positions shown; findings below may reference images not displayed]

FINDINGS: No evidence of fracture, dislocation, or joint effusion. No evidence
of arthropathy or other focal bone abnormality. Soft tissues are
unremarkable.
IMPRESSION: No acute abnormality noted.
